# Patient Record
Sex: Female | Born: 1990
Health system: Southern US, Community
[De-identification: ages and names within clinical notes are randomized; demographics above are authoritative.]

## PROBLEM LIST (undated history)

## (undated) DIAGNOSIS — Q250 Patent ductus arteriosus: Secondary | ICD-10-CM

## (undated) DIAGNOSIS — Q971 Female with more than three X chromosomes: Secondary | ICD-10-CM

## (undated) HISTORY — PX: LEG SURGERY: SHX1003

---
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
## 2011-12-25 ENCOUNTER — Emergency Department (HOSPITAL_COMMUNITY): Payer: Medicaid Other

## 2011-12-25 ENCOUNTER — Emergency Department (HOSPITAL_COMMUNITY)
Admission: EM | Admit: 2011-12-25 | Discharge: 2011-12-25 | Disposition: A | Discharge status: Home / Self Care | Payer: Medicaid Other | Attending: Emergency Medicine | Admitting: Emergency Medicine

## 2011-12-25 ENCOUNTER — Encounter (HOSPITAL_COMMUNITY): Payer: Self-pay | Admitting: *Deleted

## 2011-12-25 DIAGNOSIS — R531 Weakness: Secondary | ICD-10-CM

## 2011-12-25 DIAGNOSIS — R51 Headache: Secondary | ICD-10-CM | POA: Insufficient documentation

## 2011-12-25 DIAGNOSIS — Y929 Unspecified place or not applicable: Secondary | ICD-10-CM | POA: Insufficient documentation

## 2011-12-25 DIAGNOSIS — J029 Acute pharyngitis, unspecified: Secondary | ICD-10-CM | POA: Insufficient documentation

## 2011-12-25 DIAGNOSIS — Z87798 Personal history of other (corrected) congenital malformations: Secondary | ICD-10-CM | POA: Insufficient documentation

## 2011-12-25 DIAGNOSIS — J3489 Other specified disorders of nose and nasal sinuses: Secondary | ICD-10-CM | POA: Insufficient documentation

## 2011-12-25 DIAGNOSIS — R5381 Other malaise: Secondary | ICD-10-CM | POA: Insufficient documentation

## 2011-12-25 DIAGNOSIS — R05 Cough: Secondary | ICD-10-CM | POA: Insufficient documentation

## 2011-12-25 DIAGNOSIS — B353 Tinea pedis: Secondary | ICD-10-CM | POA: Insufficient documentation

## 2011-12-25 DIAGNOSIS — R5383 Other fatigue: Secondary | ICD-10-CM | POA: Insufficient documentation

## 2011-12-25 DIAGNOSIS — Z8774 Personal history of (corrected) congenital malformations of heart and circulatory system: Secondary | ICD-10-CM | POA: Insufficient documentation

## 2011-12-25 DIAGNOSIS — R509 Fever, unspecified: Secondary | ICD-10-CM | POA: Insufficient documentation

## 2011-12-25 DIAGNOSIS — Y939 Activity, unspecified: Secondary | ICD-10-CM | POA: Insufficient documentation

## 2011-12-25 DIAGNOSIS — Z79899 Other long term (current) drug therapy: Secondary | ICD-10-CM | POA: Insufficient documentation

## 2011-12-25 DIAGNOSIS — R059 Cough, unspecified: Secondary | ICD-10-CM | POA: Insufficient documentation

## 2011-12-25 HISTORY — DX: Patent ductus arteriosus: Q25.0

## 2011-12-25 HISTORY — DX: Female with more than three x chromosomes: Q97.1

## 2011-12-25 LAB — CBC WITH DIFFERENTIAL/PLATELET
Basophils Absolute: 0 10*3/uL (ref 0.0–0.1)
Basophils Relative: 0 % (ref 0–1)
Eosinophils Absolute: 0.2 10*3/uL (ref 0.0–0.7)
Eosinophils Relative: 3 % (ref 0–5)
HCT: 41.9 % (ref 36.0–46.0)
Hemoglobin: 13.9 g/dL (ref 12.0–15.0)
Lymphocytes Relative: 50 % — ABNORMAL HIGH (ref 12–46)
Lymphs Abs: 2.8 10*3/uL (ref 0.7–4.0)
MCH: 31.2 pg (ref 26.0–34.0)
MCHC: 33.2 g/dL (ref 30.0–36.0)
MCV: 93.9 fL (ref 78.0–100.0)
Monocytes Absolute: 0.4 10*3/uL (ref 0.1–1.0)
Monocytes Relative: 7 % (ref 3–12)
Neutro Abs: 2.2 10*3/uL (ref 1.7–7.7)
Neutrophils Relative %: 40 % — ABNORMAL LOW (ref 43–77)
Platelets: 157 10*3/uL (ref 150–400)
RBC: 4.46 MIL/uL (ref 3.87–5.11)
RDW: 12.8 % (ref 11.5–15.5)
WBC: 5.6 10*3/uL (ref 4.0–10.5)

## 2011-12-25 LAB — BASIC METABOLIC PANEL
BUN: 10 mg/dL (ref 6–23)
CO2: 28 mEq/L (ref 19–32)
Calcium: 10.8 mg/dL — ABNORMAL HIGH (ref 8.4–10.5)
Chloride: 104 mEq/L (ref 96–112)
Creatinine, Ser: 0.6 mg/dL (ref 0.50–1.10)
GFR calc Af Amer: >90 mL/min (ref >90)
GFR calc non Af Amer: >90 mL/min (ref >90)
Glucose, Bld: 95 mg/dL (ref 70–99)
Potassium: 4.1 mEq/L (ref 3.5–5.1)
Sodium: 144 mEq/L (ref 135–145)

## 2011-12-25 NOTE — ED Provider Notes (Signed)
History     CSN: [id number]  Arrival date & time 12/25/11  1724   First MD Initiated Contact with Patient 12/25/11 2028      Chief Complaint  Patient presents with  . Insect Bite    (Consider location/radiation/quality/duration/timing/severity/associated sxs/prior treatment) HPI Cindy Vaughan is a 21 y.o. female who presents with her parents with complaint of a lesion on right foot, intermittent fever, fatigue, choking episodes, and a possible syncopal episode at home. Symptoms began 11 days ago when they noted a lesion to the right foot. Per family, it looked like a spider bite with two fang marks. Pt has been seen several times by her pcp, dr Mayford Knife, and was diagnosed with possible fungal-herpetic lesion. She is currently on bactrim, mycolog cream, and acyclovir for this infection. It is getting better. However, family is concerned that pt is more fatigued, not acting like herself, and had to call 911 two last days for choking episodes where she is saying her throat hurts, and she starts having difficulty breathing, and her face turns red. She was not brought to the hospital both times, EMS thought it could be anxiety.   Past Medical History  Diagnosis Date  . Penta X syndrome   . PDA (patent ductus arteriosus)     Past Surgical History  Procedure Date  . Leg surgery     No family history on file.  History  Substance Use Topics  . Smoking status: Never Smoker   . Smokeless tobacco: Not on file  . Alcohol Use: No    OB History    Grav Para Term Preterm Abortions TAB SAB Ect Mult Living                  Review of Systems  Constitutional: Positive for fever and chills.  HENT: Positive for congestion and sore throat.   Respiratory: Positive for cough and choking.   Skin: Positive for wound.  Neurological: Positive for weakness and headaches.  All other systems reviewed and are negative.    Allergies  Review of patient's allergies indicates no known  allergies.  Home Medications   Current Outpatient Rx  Name  Route  Sig  Dispense  Refill  . ACYCLOVIR 200 MG/5ML PO SUSP   Oral   Take 200 mg by mouth 4 (four) times daily - after meals and at bedtime. 2 tsp         . NYSTATIN-TRIAMCINOLONE 100000-0.1 UNIT/GM-% EX CREA   Topical   Apply 1 application topically 2 (two) times daily. For rash on foot due to possible spider bite         . SULFAMETHOXAZOLE-TRIMETHOPRIM 200-40 MG/5ML PO SUSP   Oral   Take 10 mLs by mouth 2 (two) times daily. For 10 days           BP 94/61  Pulse 61  Temp 97.5 F (36.4 C) (Oral)  Resp 24  SpO2 98%  Physical Exam  Nursing note and vitals reviewed. Constitutional: No distress.       Thin appearing  HENT:  Head: Normocephalic.  Right Ear: External ear normal.  Left Ear: External ear normal.  Nose: Nose normal.  Mouth/Throat: Oropharynx is clear and moist. No oropharyngeal exudate.       TMs normal bilaterally  Eyes: Conjunctivae normal are normal. Pupils are equal, round, and reactive to light.  Neck: Normal range of motion. Neck supple.  Cardiovascular: Normal rate, regular rhythm and normal heart sounds.   Pulmonary/Chest: Effort  normal and breath sounds normal. No respiratory distress. She has no wheezes. She has no rales.  Abdominal: Soft. Bowel sounds are normal. She exhibits no distension. There is no tenderness. There is no rebound.  Musculoskeletal: Normal range of motion. She exhibits no edema.       See skin exam  Neurological: She is alert.  Skin: Skin is warm and dry.       There is a 3x4cm erythematous, scaly lesion with surrounding vesicular satellite lesions to the right dorsal foot, just proximal to the 1st and 2nd toes. Non tender. No drainage.   Psychiatric: She has a normal mood and affect. Her behavior is normal.    ED Course  Procedures (including critical care time)  Family here with pt, given her MR and chromosomal disease, they are her care takers. Concerned  about foot skin lesion and choking episodes. Will get cxr, labs pending.  Results for orders placed during the hospital encounter of 12/25/11  CBC WITH DIFFERENTIAL      Component Value Range   WBC 5.6  4.0 - 10.5 K/uL   RBC 4.46  3.87 - 5.11 MIL/uL   Hemoglobin 13.9  12.0 - 15.0 g/dL   HCT 19.1  47.8 - 29.5 %   MCV 93.9  78.0 - 100.0 fL   MCH 31.2  26.0 - 34.0 pg   MCHC 33.2  30.0 - 36.0 g/dL   RDW 62.1  30.8 - 65.7 %   Platelets 157  150 - 400 K/uL   Neutrophils Relative 40 (*) 43 - 77 %   Lymphocytes Relative 50 (*) 12 - 46 %   Monocytes Relative 7  3 - 12 %   Eosinophils Relative 3  0 - 5 %   Basophils Relative 0  0 - 1 %   Neutro Abs 2.2  1.7 - 7.7 K/uL   Lymphs Abs 2.8  0.7 - 4.0 K/uL   Monocytes Absolute 0.4  0.1 - 1.0 K/uL   Eosinophils Absolute 0.2  0.0 - 0.7 K/uL   Basophils Absolute 0.0  0.0 - 0.1 K/uL   WBC Morphology FEW ATYPICAL LYMPHS NOTED    BASIC METABOLIC PANEL      Component Value Range   Sodium 144  135 - 145 mEq/L   Potassium 4.1  3.5 - 5.1 mEq/L   Chloride 104  96 - 112 mEq/L   CO2 28  19 - 32 mEq/L   Glucose, Bld 95  70 - 99 mg/dL   BUN 10  6 - 23 mg/dL   Creatinine, Ser 8.46  0.50 - 1.10 mg/dL   Calcium 96.2 (*) 8.4 - 10.5 mg/dL   GFR calc non Af Amer >90  >90 mL/min   GFR calc Af Amer >90  >90 mL/min   Dg Chest 2 View  12/25/2011  *RADIOLOGY REPORT*  Clinical Data: Insect bite.Chromosomal anomaly.  CHEST - 2 VIEW  Comparison: 10/06/2006.  Findings: Dysmorphic appearance of the chest with an abnormal configuration of the ribs.  Narrowed transverse diameter of the chest.  No airspace disease.  No effusion.  Cardiopericardial silhouette appears within normal limits.  Mild levoconvex lumbar scoliosis. Left sided aortic arch.  Based on liver and splenic shadow, situs solitus appears present.  IMPRESSION: No acute cardiopulmonary disease.  Stable appearance of the chest.   Original Report Authenticated By: Andreas Newport, M.D.       1. Fungal infection  of foot   2. Weakness  MDM  Pt's foot shows fungal vs herpetic lesion, doubt infectious, no surrounding cellulitis, non tender. Pt non toxic appearing, comfortable, afebrile, normal VS. She is already being treated with acyclovir, mycolog cream, bactrim. Her labs and cxr negative. Discussed with Dr. Juleen China. Pt will need close recheck on her foot lesion with pcp, for now on correct medications. Possible onset of URI vs acid reflux could explain her throat pain and chocking episodes. Her CXR is clear, no exam does not show any abnormalities. Will refer back to PCP for recheck.           BETZABETH DERRINGER, [place] 12/25/11 2232

## 2011-12-25 NOTE — ED Notes (Signed)
Symptoms since noticing bite on Saturday November 30th on top of right foot.  Symptoms of headache, throat sore, sob, nausea, sweating, joint pain, rash to foot, fever, and lethargic.  Pt reports right elbow joint has been hurting her.  Pt is alert and the area is spreading on right foot.

## 2011-12-26 NOTE — ED Provider Notes (Signed)
Medical screening examination/treatment/procedure(s) were performed by non-physician practitioner and as supervising physician I was immediately available for consultation/collaboration.  Sinjin Amero, MD 12/26/11 0137 

## 2013-05-05 IMAGING — CR DG CHEST 2V
2 series · 2 of 2 positions shown · non-contrast
Comparison: 10/06/2006.

CLINICAL DATA: Insect bite.Chromosomal anomaly.

CHEST - 2 VIEW

[w chest pa]
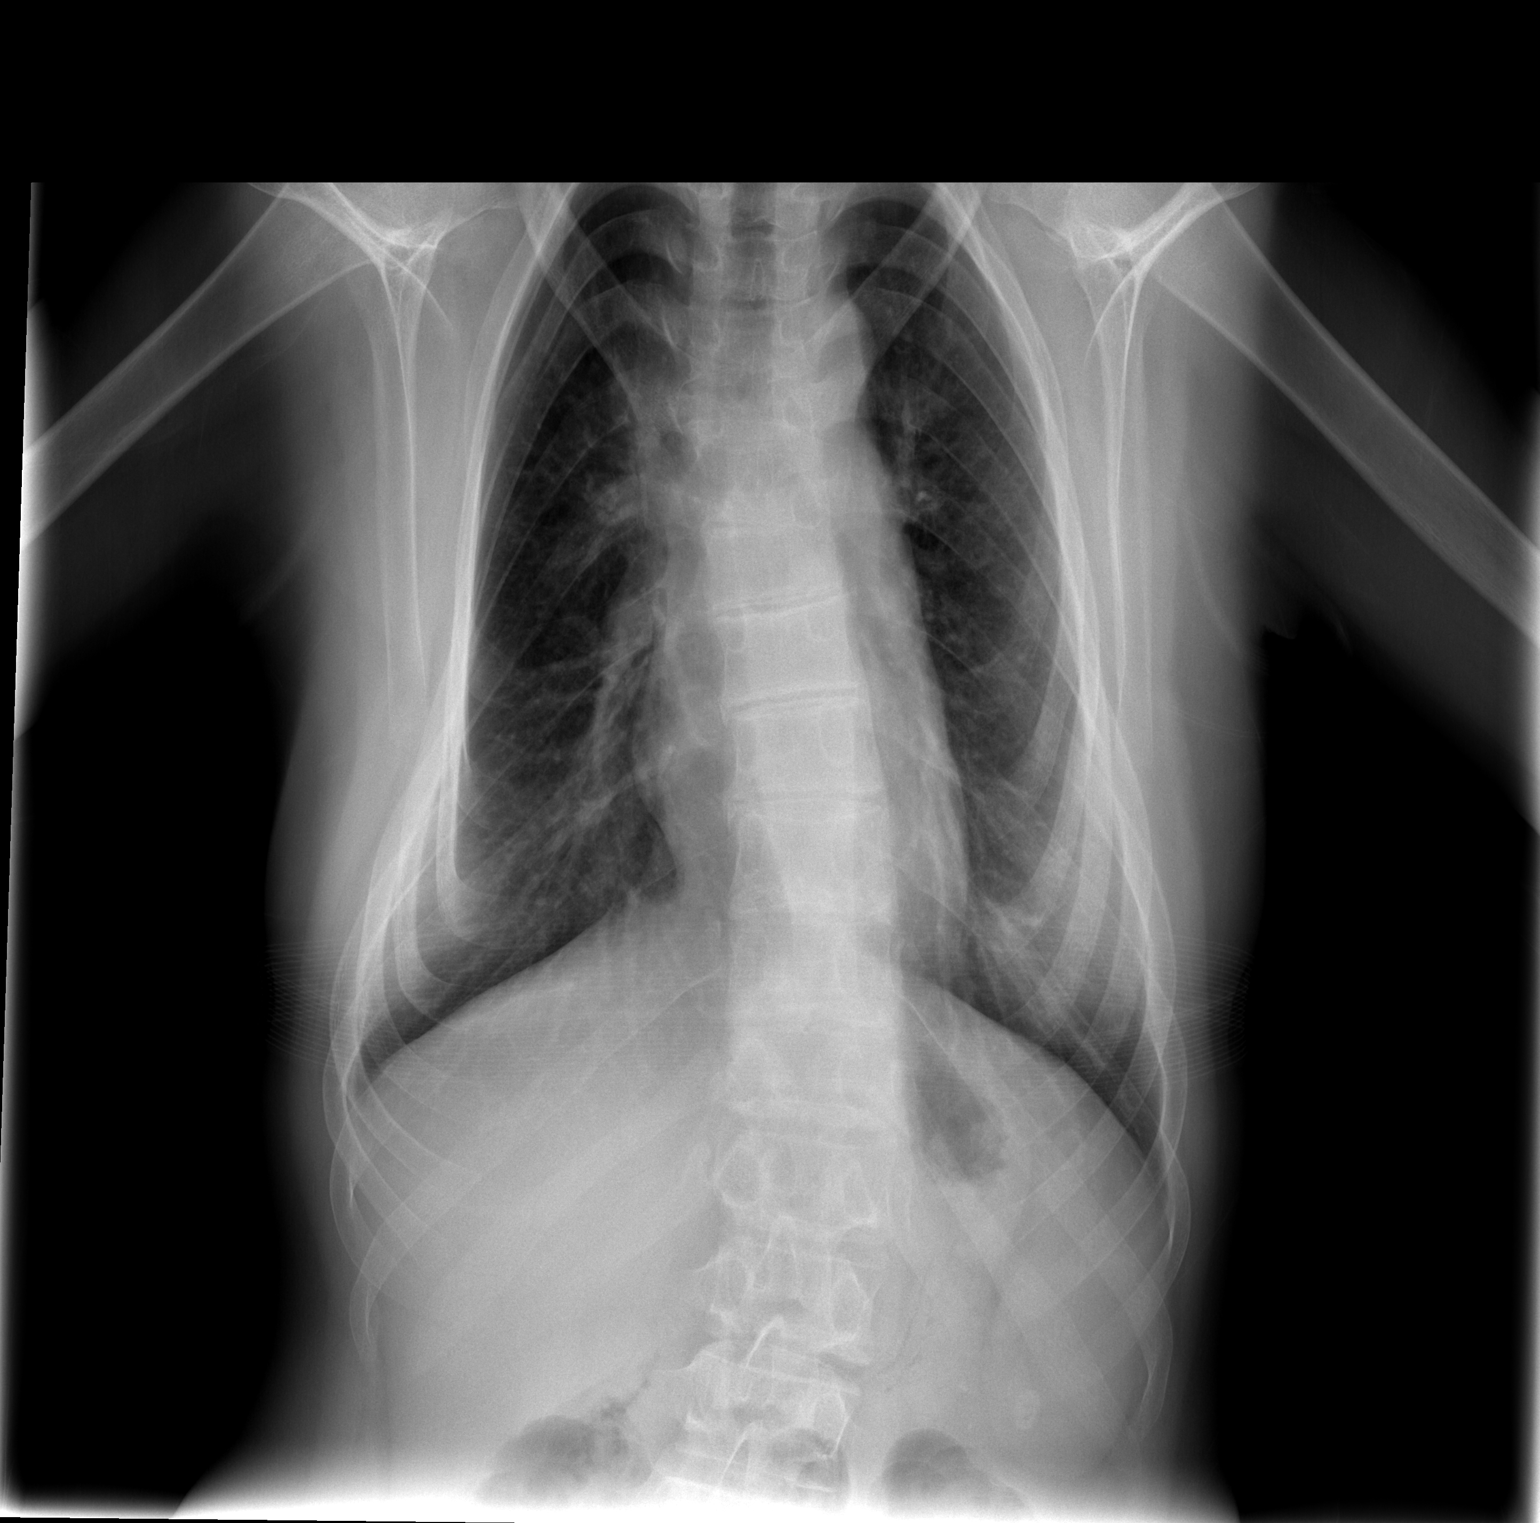

[w chest lat]
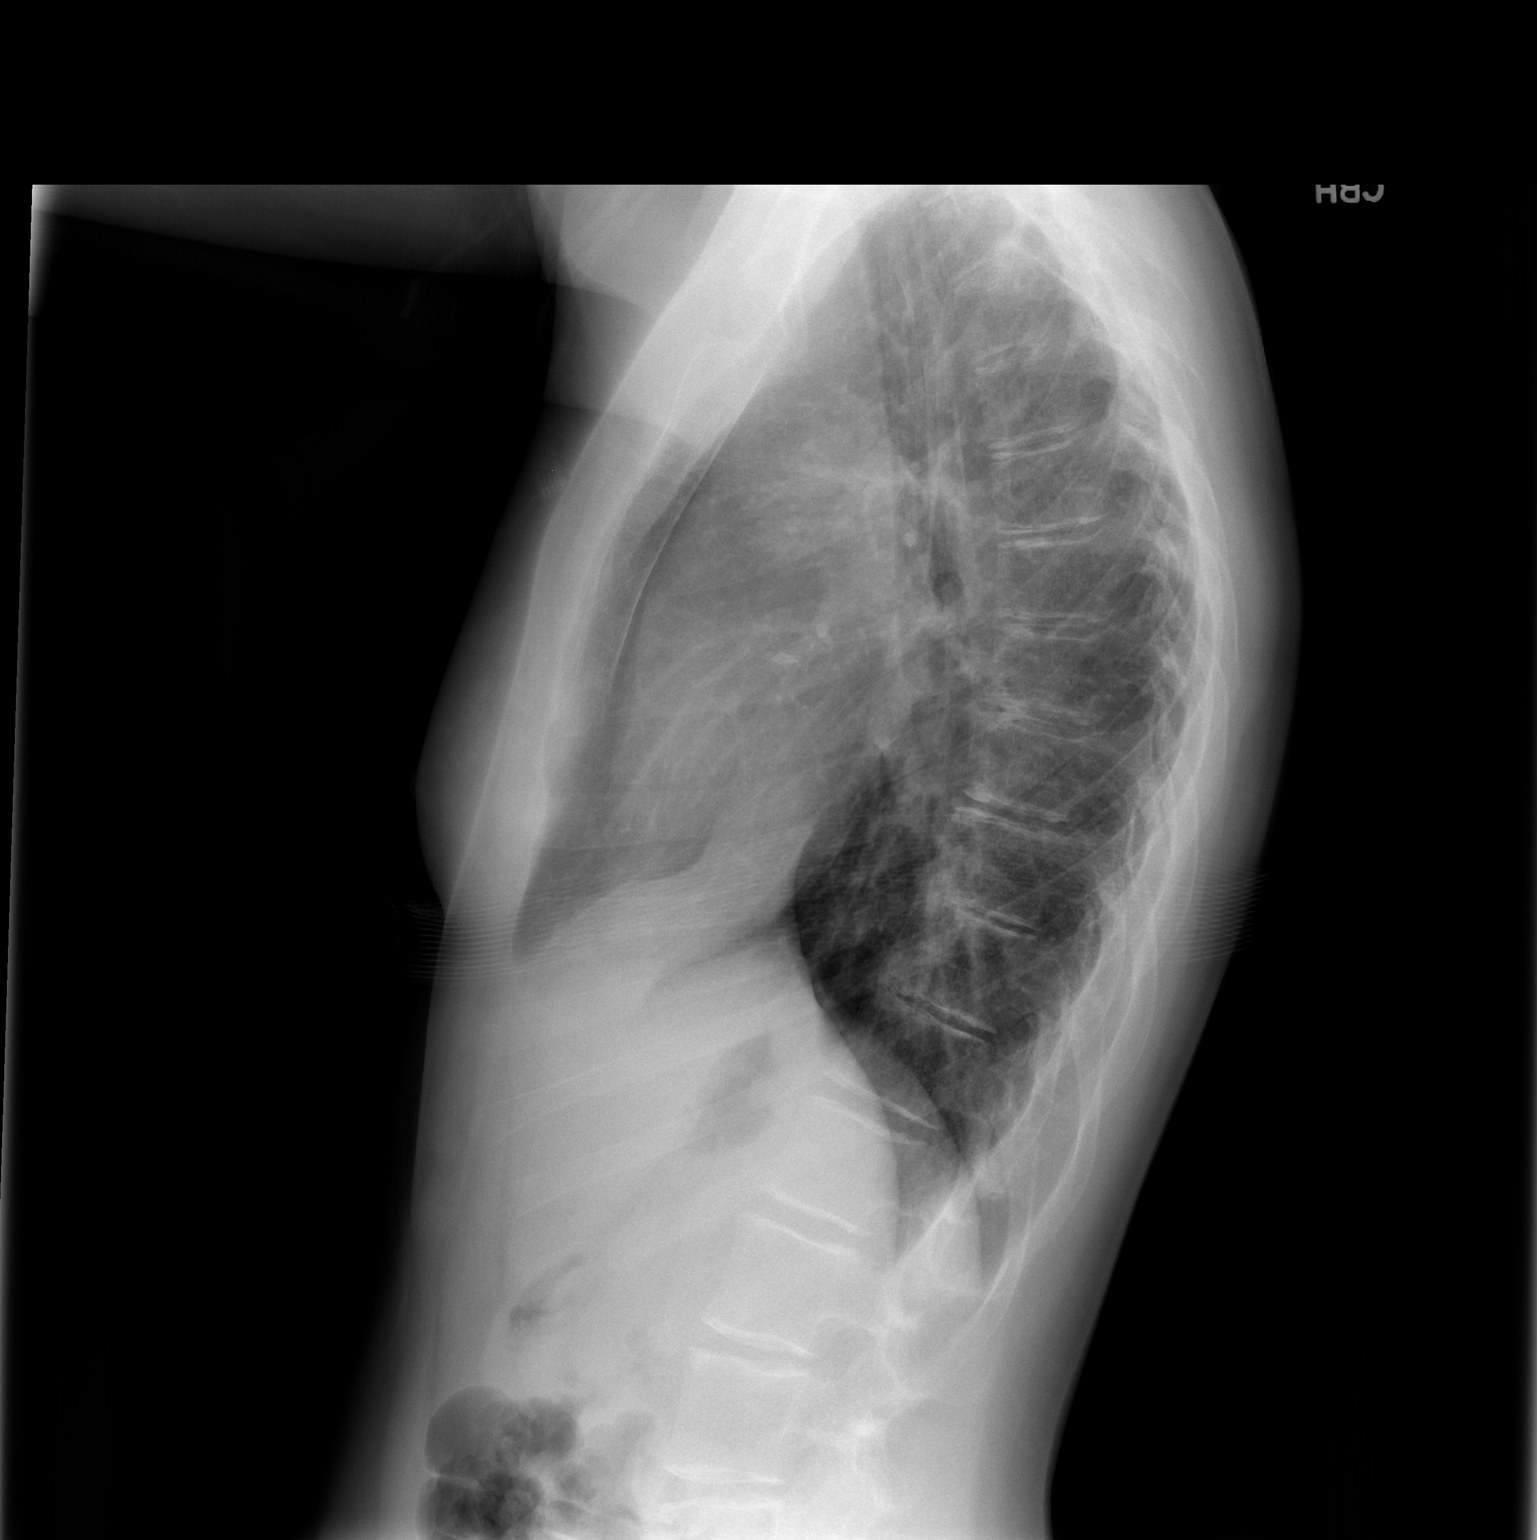

[2 of 2 positions shown; findings below may reference images not displayed]

FINDINGS: Dysmorphic appearance of the chest with an abnormal
configuration of the ribs.  Narrowed transverse diameter of the
chest.  No airspace disease.  No effusion.  Cardiopericardial
silhouette appears within normal limits.  Mild levoconvex lumbar
scoliosis. Left sided aortic arch.  Based on liver and splenic
shadow, situs solitus appears present.
IMPRESSION: No acute cardiopulmonary disease.  Stable appearance of the chest.

## 2013-06-22 DIAGNOSIS — H60399 Other infective otitis externa, unspecified ear: Secondary | ICD-10-CM | POA: Diagnosis not present

## 2013-06-22 DIAGNOSIS — H6120 Impacted cerumen, unspecified ear: Secondary | ICD-10-CM | POA: Diagnosis not present

## 2013-06-22 DIAGNOSIS — J019 Acute sinusitis, unspecified: Secondary | ICD-10-CM | POA: Diagnosis not present

## 2013-07-02 ENCOUNTER — Encounter (HOSPITAL_COMMUNITY): Payer: Self-pay | Admitting: Emergency Medicine

## 2013-07-02 ENCOUNTER — Emergency Department (HOSPITAL_COMMUNITY)
Admission: EM | Admit: 2013-07-02 | Discharge: 2013-07-02 | Disposition: A | Discharge status: Home / Self Care | Payer: Medicare Other | Source: Home / Self Care | Attending: Family Medicine | Admitting: Family Medicine

## 2013-07-02 DIAGNOSIS — Z23 Encounter for immunization: Secondary | ICD-10-CM

## 2013-07-02 DIAGNOSIS — S91309A Unspecified open wound, unspecified foot, initial encounter: Secondary | ICD-10-CM | POA: Diagnosis not present

## 2013-07-02 MED ORDER — TETANUS-DIPHTH-ACELL PERTUSSIS 5-2.5-18.5 LF-MCG/0.5 IM SUSP
INTRAMUSCULAR | Status: AC
  Filled 2013-07-02: qty 0.5

## 2013-07-02 MED ORDER — TETANUS-DIPHTH-ACELL PERTUSSIS 5-2.5-18.5 LF-MCG/0.5 IM SUSP
0.5000 mL | Freq: Once | INTRAMUSCULAR | Status: AC
  Administered 2013-07-02: 0.5 mL via INTRAMUSCULAR

## 2013-07-02 NOTE — Discharge Instructions (Signed)
Return or see your doctor if further problems.

## 2013-07-02 NOTE — ED Notes (Signed)
Stepped on a rusty metal piece two days ago.  Mom concerned for tetanus status

## 2013-07-02 NOTE — ED Provider Notes (Signed)
CSN: [id number]     Arrival date & time 07/02/13  1324 History   First MD Initiated Contact with Patient 07/02/13 1615     Chief Complaint  Patient presents with  . Immunizations   (Consider location/radiation/quality/duration/timing/severity/associated sxs/prior Treatment) Patient is a 23 y.o. female presenting with foot injury. The history is provided by a parent.  Foot Injury Location:  Foot Time since incident:  2 days Injury: yes   Mechanism of injury comment:  Stepped on rusty metal getting out of pool yest, mother removed and it bled for approx 10 min., has been appying antibiotic med since. Foot location:  Sole of L foot Pain details:    Radiates to:  Does not radiate Chronicity:  New Dislocation: no   Tetanus status:  Out of date Prior injury to area:  No   Past Medical History  Diagnosis Date  . Penta X syndrome   . PDA (patent ductus arteriosus)    Past Surgical History  Procedure Laterality Date  . Leg surgery     No family history on file. History  Substance Use Topics  . Smoking status: Never Smoker   . Smokeless tobacco: Not on file  . Alcohol Use: No   OB History   Grav Para Term Preterm Abortions TAB SAB Ect Mult Living                 Review of Systems  Constitutional: Negative.   Skin: Positive for wound.    Allergies  Review of patient's allergies indicates no known allergies.  Home Medications   Prior to Admission medications   Medication Sig Start Date End Date Taking? Authorizing Provider  acyclovir (ZOVIRAX) 200 MG/5ML suspension Take 200 mg by mouth 4 (four) times daily - after meals and at bedtime. 2 tsp    Historical Provider, MD  nystatin-triamcinolone (MYCOLOG II) cream Apply 1 application topically 2 (two) times daily. For rash on foot due to possible spider bite    Historical Provider, MD  sulfamethoxazole-trimethoprim (BACTRIM,SEPTRA) 200-40 MG/5ML suspension Take 10 mLs by mouth 2 (two) times daily. For 10 days    Historical  Provider, MD   BP 98/66  Pulse 75  Temp(Src) 98.5 F (36.9 C) (Oral)  Resp 15  SpO2 98% Physical Exam  Nursing note and vitals reviewed. Constitutional: She is oriented to person, place, and time. She appears well-developed and well-nourished.  Neurological: She is alert and oriented to person, place, and time.  Skin: Skin is warm and dry. No erythema.  Benign puncture site to 5th mt area of plantar aspect of left foot.    ED Course  Procedures (including critical care time) Labs Review Labs Reviewed - No data to display  Imaging Review No results found.   MDM   1. Immunization due        Linna HoffJames D Kindl, MD 07/02/13 [phone]

## 2013-08-02 DIAGNOSIS — N390 Urinary tract infection, site not specified: Secondary | ICD-10-CM | POA: Diagnosis not present

## 2013-08-02 DIAGNOSIS — R319 Hematuria, unspecified: Secondary | ICD-10-CM | POA: Diagnosis not present

## 2013-08-02 DIAGNOSIS — G4700 Insomnia, unspecified: Secondary | ICD-10-CM | POA: Diagnosis not present

## 2013-08-23 DIAGNOSIS — N390 Urinary tract infection, site not specified: Secondary | ICD-10-CM | POA: Diagnosis not present

## 2013-08-23 DIAGNOSIS — R319 Hematuria, unspecified: Secondary | ICD-10-CM | POA: Diagnosis not present

## 2014-07-27 DIAGNOSIS — S169XXS Unspecified injury of muscle, fascia and tendon at neck level, sequela: Secondary | ICD-10-CM | POA: Diagnosis not present

## 2014-07-27 DIAGNOSIS — K088 Other specified disorders of teeth and supporting structures: Secondary | ICD-10-CM | POA: Diagnosis not present

## 2015-08-09 DIAGNOSIS — Z3480 Encounter for supervision of other normal pregnancy, unspecified trimester: Secondary | ICD-10-CM | POA: Diagnosis not present

## 2015-08-09 DIAGNOSIS — E509 Vitamin A deficiency, unspecified: Secondary | ICD-10-CM | POA: Diagnosis not present

## 2015-08-09 DIAGNOSIS — R791 Abnormal coagulation profile: Secondary | ICD-10-CM | POA: Diagnosis not present

## 2015-08-09 DIAGNOSIS — E7801 Familial hypercholesterolemia: Secondary | ICD-10-CM | POA: Diagnosis not present

## 2015-10-30 DIAGNOSIS — Q188 Other specified congenital malformations of face and neck: Secondary | ICD-10-CM | POA: Diagnosis not present

## 2015-10-30 DIAGNOSIS — F71 Moderate intellectual disabilities: Secondary | ICD-10-CM | POA: Diagnosis not present

## 2015-11-15 ENCOUNTER — Ambulatory Visit: Payer: Medicare Other | Admitting: Podiatry

## 2015-11-21 ENCOUNTER — Ambulatory Visit (INDEPENDENT_AMBULATORY_CARE_PROVIDER_SITE_OTHER): Payer: Medicare Other

## 2015-11-21 ENCOUNTER — Ambulatory Visit (INDEPENDENT_AMBULATORY_CARE_PROVIDER_SITE_OTHER): Payer: Medicare Other | Admitting: Podiatry

## 2015-11-21 ENCOUNTER — Encounter: Payer: Self-pay | Admitting: Podiatry

## 2015-11-21 ENCOUNTER — Other Ambulatory Visit: Payer: Self-pay | Admitting: *Deleted

## 2015-11-21 VITALS — BP 96/67 | HR 59 | Resp 16

## 2015-11-21 DIAGNOSIS — M2042 Other hammer toe(s) (acquired), left foot: Secondary | ICD-10-CM

## 2015-11-21 DIAGNOSIS — M79671 Pain in right foot: Secondary | ICD-10-CM

## 2015-11-21 DIAGNOSIS — M79672 Pain in left foot: Secondary | ICD-10-CM | POA: Diagnosis not present

## 2015-11-21 DIAGNOSIS — M216X9 Other acquired deformities of unspecified foot: Secondary | ICD-10-CM | POA: Diagnosis not present

## 2015-11-21 DIAGNOSIS — M21869 Other specified acquired deformities of unspecified lower leg: Secondary | ICD-10-CM

## 2015-11-21 DIAGNOSIS — M2010 Hallux valgus (acquired), unspecified foot: Secondary | ICD-10-CM

## 2015-11-21 DIAGNOSIS — Q6650 Congenital pes planus, unspecified foot: Secondary | ICD-10-CM

## 2015-11-21 DIAGNOSIS — M2041 Other hammer toe(s) (acquired), right foot: Secondary | ICD-10-CM

## 2015-11-21 NOTE — Progress Notes (Signed)
   Subjective:    Patient ID: Cindy Vaughan, female    DOB: [birth year], 25 y.o.   MRN: [id number]  HPI: She presents today with her mother and she is complaining of painful posterior legs and heels bilaterally. She states that they tingle and they hurt. She states this is been going on for many years and has been seen by an orthopedic specialist in the past for tibial valgum which was successfully reduced.  Cindy Vaughan has Pent "X" syndrome which is 5X chromosomes. This has resulted in the late development and abnormal osseous structures.  Her mother is concerned about her tight Achilles and chronic pain as well as her the way her daughter walks particularly with flatfeet and abduction of the forefoot and hindfoot. She is also concerned about the severe angular deformities of her toes bilaterally.    Review of Systems  All other systems reviewed and are negative.      Objective:   Physical Exam: Vital signs are stable she is alert and oriented she is not very focal but does seem to understand conversation. Pulses are strongly palpable bilateral. Neurologic sensorium is intact deep tendon reflexes are not elicitable and muscle strength is bearable with dorsiflexion plantarflexion inversion and eversion secondary to not being able to comprehend the demands. She does have a little clonic tonic type muscle tone. She has moderate to severe gastroc equinus with tenderness on palpation of the sural nerve distribution and the Achilles. She has severe pes planus flexible in nature without coalition based on physical exam and radiographic evaluation. She does have severe hallux valgus deformity as well as hammertoe deformities. Radiographs taken today do demonstrate osseously mature individual with decent bone stock. No spurring but hammertoe deformities and increase in forefoot deformity is moderate to severe. Cutaneous evaluation does not demonstrate any osseous abnormalities.    Assessment & Plan:    Assessment: Young lady with a developmental disorder. Gastroc equinus and tight Achilles tendon resulting in flatfoot deformity and severe hammertoe deformities and bunion deformities. Sural nerve neuritis  Plan: Discussed etiology pathology conservative versus surgical therapies. At this point I recommended that she follow-up with Dr. Loreta AveWagner to consider gastroc recession Achilles tendon lengthening flatfoot repair and a forefoot reconstruction.  I expressed to the patient and her mother that this is a lot of surgery and he may have to be staged. Her mother understands this and is amenable to it like to have her daughter out of pain. I highly recommend medical clearance as well as a lot of work to make sure there are no other healing abnormalities. Her mother did state that she healed adequately and quickly with her tibial osteotomy. I will be happy to assist in any surgery performed.

## 2015-11-24 DIAGNOSIS — Q188 Other specified congenital malformations of face and neck: Secondary | ICD-10-CM | POA: Diagnosis not present

## 2015-11-24 DIAGNOSIS — F71 Moderate intellectual disabilities: Secondary | ICD-10-CM | POA: Diagnosis not present

## 2015-12-29 ENCOUNTER — Encounter: Payer: Self-pay | Admitting: Podiatry

## 2015-12-29 ENCOUNTER — Ambulatory Visit (INDEPENDENT_AMBULATORY_CARE_PROVIDER_SITE_OTHER): Payer: Medicare Other | Admitting: Podiatry

## 2015-12-29 DIAGNOSIS — M216X9 Other acquired deformities of unspecified foot: Secondary | ICD-10-CM

## 2015-12-29 DIAGNOSIS — M2042 Other hammer toe(s) (acquired), left foot: Secondary | ICD-10-CM

## 2015-12-29 DIAGNOSIS — Q6650 Congenital pes planus, unspecified foot: Secondary | ICD-10-CM | POA: Diagnosis not present

## 2015-12-29 DIAGNOSIS — M2041 Other hammer toe(s) (acquired), right foot: Secondary | ICD-10-CM | POA: Diagnosis not present

## 2015-12-29 DIAGNOSIS — M2010 Hallux valgus (acquired), unspecified foot: Secondary | ICD-10-CM

## 2015-12-29 DIAGNOSIS — M21869 Other specified acquired deformities of unspecified lower leg: Secondary | ICD-10-CM

## 2016-01-01 DIAGNOSIS — M2042 Other hammer toe(s) (acquired), left foot: Secondary | ICD-10-CM

## 2016-01-01 DIAGNOSIS — M2041 Other hammer toe(s) (acquired), right foot: Secondary | ICD-10-CM | POA: Insufficient documentation

## 2016-01-01 DIAGNOSIS — M2010 Hallux valgus (acquired), unspecified foot: Secondary | ICD-10-CM | POA: Insufficient documentation

## 2016-01-01 DIAGNOSIS — Q6650 Congenital pes planus, unspecified foot: Secondary | ICD-10-CM | POA: Insufficient documentation

## 2016-01-01 NOTE — Progress Notes (Signed)
Subjective: 25 year old female presents the office today with her parents or concerns of bilateral flatfeet, bunions. She presents today for surgical consultation at the request of Dr. Al CorpusHyatt. Before starting the limited the patient's mom states they do not want to proceed with surgery. The patient is able to do daily activities for any pain. He went to the parade the other weekend and she is able to walk around and not have any pain. Then using a topical cream to her feet which does help when she does get discomfort. Other than the cream she's had no other significant treatment. Denies any systemic complaints such as fevers, chills, nausea, vomiting. No acute changes since last appointment, and no other complaints at this time.   Objective: NAD DP/PT pulses palpable bilaterally, CRT less than 3 seconds There is a decrease in medial arch upon weightbearing. Moderate HAV is present. Hammertoes are present as well. Ankle, subtalar joint range of motion intact. Equinus is present. There is no specific area pinpoint bony tenderness there is no pain vibratory sensation bilaterally. No edema, erythema, increase in warmth to bilateral lower extremities.  No open lesions or pre-ulcerative lesions.  No pain with calf compression, swelling, warmth, erythema  Assessment: 25 year old female flatfoot, equinus, HAV with hammertoes.  Plan: -All treatment options discussed with the patient including all alternatives, risks, complications.  -Previous x-rays as well as the chart was reviewed. -I discussed both conservative and surgical treatment options. This time she has not had significant conservative treatment and we will start with this before proceeding with any surgical intervention. I recommend shoe gear changes and orthotics. They're instrument over-the-counter insert is seen how she does. Also I recommended physical therapy to help with the equinus. A prescription for bench marked physical therapy was  provided today. -Follow-up to physical therapy or sooner if any issues are to arise. -Patient encouraged to call the office with any questions, concerns, change in symptoms.   Cindy CurdMatthew Rosalea Vaughan, DPM

## 2016-02-16 DIAGNOSIS — H6123 Impacted cerumen, bilateral: Secondary | ICD-10-CM | POA: Diagnosis not present

## 2016-03-06 DIAGNOSIS — H9203 Otalgia, bilateral: Secondary | ICD-10-CM | POA: Diagnosis not present

## 2016-03-06 DIAGNOSIS — H93293 Other abnormal auditory perceptions, bilateral: Secondary | ICD-10-CM | POA: Diagnosis not present

## 2016-03-06 DIAGNOSIS — Q978 Other specified sex chromosome abnormalities, female phenotype: Secondary | ICD-10-CM | POA: Diagnosis not present

## 2016-03-06 DIAGNOSIS — H6123 Impacted cerumen, bilateral: Secondary | ICD-10-CM | POA: Diagnosis not present

## 2016-05-09 DIAGNOSIS — Z131 Encounter for screening for diabetes mellitus: Secondary | ICD-10-CM | POA: Diagnosis not present

## 2016-05-09 DIAGNOSIS — R69 Illness, unspecified: Secondary | ICD-10-CM | POA: Diagnosis not present

## 2016-05-09 DIAGNOSIS — M542 Cervicalgia: Secondary | ICD-10-CM | POA: Diagnosis not present

## 2016-05-17 DIAGNOSIS — M542 Cervicalgia: Secondary | ICD-10-CM | POA: Diagnosis not present

## 2016-05-17 DIAGNOSIS — R69 Illness, unspecified: Secondary | ICD-10-CM | POA: Diagnosis not present

## 2016-07-31 DIAGNOSIS — J302 Other seasonal allergic rhinitis: Secondary | ICD-10-CM | POA: Diagnosis not present

## 2016-07-31 DIAGNOSIS — R69 Illness, unspecified: Secondary | ICD-10-CM | POA: Diagnosis not present

## 2016-07-31 DIAGNOSIS — Z131 Encounter for screening for diabetes mellitus: Secondary | ICD-10-CM | POA: Diagnosis not present

## 2016-07-31 DIAGNOSIS — R1313 Dysphagia, pharyngeal phase: Secondary | ICD-10-CM | POA: Diagnosis not present

## 2016-07-31 DIAGNOSIS — M542 Cervicalgia: Secondary | ICD-10-CM | POA: Diagnosis not present

## 2016-07-31 DIAGNOSIS — M4120 Other idiopathic scoliosis, site unspecified: Secondary | ICD-10-CM | POA: Diagnosis not present

## 2016-08-21 DIAGNOSIS — R1312 Dysphagia, oropharyngeal phase: Secondary | ICD-10-CM | POA: Diagnosis not present

## 2016-08-21 DIAGNOSIS — K5900 Constipation, unspecified: Secondary | ICD-10-CM | POA: Diagnosis not present

## 2016-08-27 DIAGNOSIS — M40209 Unspecified kyphosis, site unspecified: Secondary | ICD-10-CM | POA: Diagnosis not present

## 2016-08-27 DIAGNOSIS — Z681 Body mass index (BMI) 19 or less, adult: Secondary | ICD-10-CM | POA: Diagnosis not present

## 2016-10-14 DIAGNOSIS — Q971 Female with more than three X chromosomes: Secondary | ICD-10-CM | POA: Diagnosis not present

## 2016-10-14 DIAGNOSIS — M40209 Unspecified kyphosis, site unspecified: Secondary | ICD-10-CM | POA: Diagnosis not present

## 2016-10-29 DIAGNOSIS — Z681 Body mass index (BMI) 19 or less, adult: Secondary | ICD-10-CM | POA: Diagnosis not present

## 2016-10-29 DIAGNOSIS — M40209 Unspecified kyphosis, site unspecified: Secondary | ICD-10-CM | POA: Diagnosis not present

## 2016-11-01 DIAGNOSIS — R1313 Dysphagia, pharyngeal phase: Secondary | ICD-10-CM | POA: Diagnosis not present

## 2016-11-01 DIAGNOSIS — M4120 Other idiopathic scoliosis, site unspecified: Secondary | ICD-10-CM | POA: Diagnosis not present

## 2016-11-01 DIAGNOSIS — M542 Cervicalgia: Secondary | ICD-10-CM | POA: Diagnosis not present

## 2016-11-01 DIAGNOSIS — B80 Enterobiasis: Secondary | ICD-10-CM | POA: Diagnosis not present

## 2016-11-01 DIAGNOSIS — R69 Illness, unspecified: Secondary | ICD-10-CM | POA: Diagnosis not present

## 2016-12-10 DIAGNOSIS — Z681 Body mass index (BMI) 19 or less, adult: Secondary | ICD-10-CM | POA: Diagnosis not present

## 2016-12-10 DIAGNOSIS — M40209 Unspecified kyphosis, site unspecified: Secondary | ICD-10-CM | POA: Diagnosis not present

## 2017-01-31 DIAGNOSIS — R1313 Dysphagia, pharyngeal phase: Secondary | ICD-10-CM | POA: Diagnosis not present

## 2017-01-31 DIAGNOSIS — R69 Illness, unspecified: Secondary | ICD-10-CM | POA: Diagnosis not present

## 2017-01-31 DIAGNOSIS — M542 Cervicalgia: Secondary | ICD-10-CM | POA: Diagnosis not present

## 2017-01-31 DIAGNOSIS — M4120 Other idiopathic scoliosis, site unspecified: Secondary | ICD-10-CM | POA: Diagnosis not present

## 2017-02-18 DIAGNOSIS — H6123 Impacted cerumen, bilateral: Secondary | ICD-10-CM | POA: Diagnosis not present

## 2017-02-21 DIAGNOSIS — R1313 Dysphagia, pharyngeal phase: Secondary | ICD-10-CM | POA: Diagnosis not present

## 2017-02-21 DIAGNOSIS — M542 Cervicalgia: Secondary | ICD-10-CM | POA: Diagnosis not present

## 2017-02-21 DIAGNOSIS — M4120 Other idiopathic scoliosis, site unspecified: Secondary | ICD-10-CM | POA: Diagnosis not present

## 2017-02-21 DIAGNOSIS — K047 Periapical abscess without sinus: Secondary | ICD-10-CM | POA: Diagnosis not present

## 2017-02-21 DIAGNOSIS — R69 Illness, unspecified: Secondary | ICD-10-CM | POA: Diagnosis not present

## 2017-05-05 DIAGNOSIS — K5900 Constipation, unspecified: Secondary | ICD-10-CM | POA: Diagnosis not present

## 2017-05-05 DIAGNOSIS — K047 Periapical abscess without sinus: Secondary | ICD-10-CM | POA: Diagnosis not present

## 2017-05-05 DIAGNOSIS — Z833 Family history of diabetes mellitus: Secondary | ICD-10-CM | POA: Diagnosis not present

## 2017-05-05 DIAGNOSIS — R69 Illness, unspecified: Secondary | ICD-10-CM | POA: Diagnosis not present

## 2017-05-05 DIAGNOSIS — Z823 Family history of stroke: Secondary | ICD-10-CM | POA: Diagnosis not present

## 2017-05-05 DIAGNOSIS — K219 Gastro-esophageal reflux disease without esophagitis: Secondary | ICD-10-CM | POA: Diagnosis not present

## 2017-05-09 DIAGNOSIS — M542 Cervicalgia: Secondary | ICD-10-CM | POA: Diagnosis not present

## 2017-05-09 DIAGNOSIS — K120 Recurrent oral aphthae: Secondary | ICD-10-CM | POA: Diagnosis not present

## 2017-05-09 DIAGNOSIS — M4120 Other idiopathic scoliosis, site unspecified: Secondary | ICD-10-CM | POA: Diagnosis not present

## 2017-05-09 DIAGNOSIS — L0100 Impetigo, unspecified: Secondary | ICD-10-CM | POA: Diagnosis not present

## 2017-05-09 DIAGNOSIS — R69 Illness, unspecified: Secondary | ICD-10-CM | POA: Diagnosis not present

## 2017-05-09 DIAGNOSIS — K121 Other forms of stomatitis: Secondary | ICD-10-CM | POA: Diagnosis not present

## 2017-05-09 DIAGNOSIS — R1313 Dysphagia, pharyngeal phase: Secondary | ICD-10-CM | POA: Diagnosis not present

## 2017-05-20 DIAGNOSIS — W57XXXS Bitten or stung by nonvenomous insect and other nonvenomous arthropods, sequela: Secondary | ICD-10-CM | POA: Diagnosis not present

## 2017-05-20 DIAGNOSIS — M4120 Other idiopathic scoliosis, site unspecified: Secondary | ICD-10-CM | POA: Diagnosis not present

## 2017-05-20 DIAGNOSIS — R69 Illness, unspecified: Secondary | ICD-10-CM | POA: Diagnosis not present

## 2017-05-20 DIAGNOSIS — M542 Cervicalgia: Secondary | ICD-10-CM | POA: Diagnosis not present

## 2017-05-20 DIAGNOSIS — R1313 Dysphagia, pharyngeal phase: Secondary | ICD-10-CM | POA: Diagnosis not present

## 2017-05-20 DIAGNOSIS — S50862S Insect bite (nonvenomous) of left forearm, sequela: Secondary | ICD-10-CM | POA: Diagnosis not present

## 2017-06-27 DIAGNOSIS — R69 Illness, unspecified: Secondary | ICD-10-CM | POA: Diagnosis not present

## 2017-06-27 DIAGNOSIS — R1313 Dysphagia, pharyngeal phase: Secondary | ICD-10-CM | POA: Diagnosis not present

## 2017-06-27 DIAGNOSIS — M542 Cervicalgia: Secondary | ICD-10-CM | POA: Diagnosis not present

## 2017-06-27 DIAGNOSIS — M4120 Other idiopathic scoliosis, site unspecified: Secondary | ICD-10-CM | POA: Diagnosis not present

## 2017-06-27 DIAGNOSIS — K047 Periapical abscess without sinus: Secondary | ICD-10-CM | POA: Diagnosis not present

## 2017-07-11 DIAGNOSIS — M4120 Other idiopathic scoliosis, site unspecified: Secondary | ICD-10-CM | POA: Diagnosis not present

## 2017-07-11 DIAGNOSIS — R69 Illness, unspecified: Secondary | ICD-10-CM | POA: Diagnosis not present

## 2017-07-11 DIAGNOSIS — R1313 Dysphagia, pharyngeal phase: Secondary | ICD-10-CM | POA: Diagnosis not present

## 2017-07-11 DIAGNOSIS — M542 Cervicalgia: Secondary | ICD-10-CM | POA: Diagnosis not present

## 2017-07-14 DIAGNOSIS — H6123 Impacted cerumen, bilateral: Secondary | ICD-10-CM | POA: Diagnosis not present

## 2017-08-20 DIAGNOSIS — M542 Cervicalgia: Secondary | ICD-10-CM | POA: Diagnosis not present

## 2017-08-20 DIAGNOSIS — R69 Illness, unspecified: Secondary | ICD-10-CM | POA: Diagnosis not present

## 2017-08-20 DIAGNOSIS — R1313 Dysphagia, pharyngeal phase: Secondary | ICD-10-CM | POA: Diagnosis not present

## 2017-08-20 DIAGNOSIS — M4120 Other idiopathic scoliosis, site unspecified: Secondary | ICD-10-CM | POA: Diagnosis not present

## 2017-11-19 DIAGNOSIS — K0889 Other specified disorders of teeth and supporting structures: Secondary | ICD-10-CM | POA: Diagnosis not present

## 2017-11-19 DIAGNOSIS — M542 Cervicalgia: Secondary | ICD-10-CM | POA: Diagnosis not present

## 2017-11-19 DIAGNOSIS — M4120 Other idiopathic scoliosis, site unspecified: Secondary | ICD-10-CM | POA: Diagnosis not present

## 2017-11-19 DIAGNOSIS — R69 Illness, unspecified: Secondary | ICD-10-CM | POA: Diagnosis not present

## 2017-11-28 DIAGNOSIS — M542 Cervicalgia: Secondary | ICD-10-CM | POA: Diagnosis not present

## 2017-11-28 DIAGNOSIS — R69 Illness, unspecified: Secondary | ICD-10-CM | POA: Diagnosis not present

## 2017-11-28 DIAGNOSIS — M4120 Other idiopathic scoliosis, site unspecified: Secondary | ICD-10-CM | POA: Diagnosis not present

## 2018-02-18 DIAGNOSIS — R69 Illness, unspecified: Secondary | ICD-10-CM | POA: Diagnosis not present

## 2018-02-18 DIAGNOSIS — L309 Dermatitis, unspecified: Secondary | ICD-10-CM | POA: Diagnosis not present

## 2018-02-18 DIAGNOSIS — H6123 Impacted cerumen, bilateral: Secondary | ICD-10-CM | POA: Diagnosis not present

## 2018-02-18 DIAGNOSIS — M4120 Other idiopathic scoliosis, site unspecified: Secondary | ICD-10-CM | POA: Diagnosis not present

## 2018-02-18 DIAGNOSIS — M542 Cervicalgia: Secondary | ICD-10-CM | POA: Diagnosis not present

## 2018-03-04 DIAGNOSIS — M542 Cervicalgia: Secondary | ICD-10-CM | POA: Diagnosis not present

## 2018-03-04 DIAGNOSIS — M4120 Other idiopathic scoliosis, site unspecified: Secondary | ICD-10-CM | POA: Diagnosis not present

## 2018-03-04 DIAGNOSIS — J019 Acute sinusitis, unspecified: Secondary | ICD-10-CM | POA: Diagnosis not present

## 2018-03-04 DIAGNOSIS — R69 Illness, unspecified: Secondary | ICD-10-CM | POA: Diagnosis not present

## 2018-03-04 DIAGNOSIS — H6123 Impacted cerumen, bilateral: Secondary | ICD-10-CM | POA: Diagnosis not present

## 2018-03-04 DIAGNOSIS — L309 Dermatitis, unspecified: Secondary | ICD-10-CM | POA: Diagnosis not present

## 2018-03-04 DIAGNOSIS — J028 Acute pharyngitis due to other specified organisms: Secondary | ICD-10-CM | POA: Diagnosis not present

## 2018-03-31 DIAGNOSIS — H6123 Impacted cerumen, bilateral: Secondary | ICD-10-CM | POA: Diagnosis not present

## 2018-05-20 DIAGNOSIS — R69 Illness, unspecified: Secondary | ICD-10-CM | POA: Diagnosis not present

## 2018-05-20 DIAGNOSIS — M542 Cervicalgia: Secondary | ICD-10-CM | POA: Diagnosis not present

## 2018-05-20 DIAGNOSIS — L309 Dermatitis, unspecified: Secondary | ICD-10-CM | POA: Diagnosis not present

## 2018-05-20 DIAGNOSIS — M4120 Other idiopathic scoliosis, site unspecified: Secondary | ICD-10-CM | POA: Diagnosis not present

## 2018-05-20 DIAGNOSIS — J028 Acute pharyngitis due to other specified organisms: Secondary | ICD-10-CM | POA: Diagnosis not present

## 2018-06-03 DIAGNOSIS — L309 Dermatitis, unspecified: Secondary | ICD-10-CM | POA: Diagnosis not present

## 2018-06-03 DIAGNOSIS — M542 Cervicalgia: Secondary | ICD-10-CM | POA: Diagnosis not present

## 2018-06-03 DIAGNOSIS — M4120 Other idiopathic scoliosis, site unspecified: Secondary | ICD-10-CM | POA: Diagnosis not present

## 2018-06-03 DIAGNOSIS — K0889 Other specified disorders of teeth and supporting structures: Secondary | ICD-10-CM | POA: Diagnosis not present

## 2018-06-03 DIAGNOSIS — R69 Illness, unspecified: Secondary | ICD-10-CM | POA: Diagnosis not present

## 2018-06-24 DIAGNOSIS — M21962 Unspecified acquired deformity of left lower leg: Secondary | ICD-10-CM | POA: Diagnosis not present

## 2018-06-24 DIAGNOSIS — M21961 Unspecified acquired deformity of right lower leg: Secondary | ICD-10-CM | POA: Diagnosis not present

## 2018-06-24 DIAGNOSIS — M4120 Other idiopathic scoliosis, site unspecified: Secondary | ICD-10-CM | POA: Diagnosis not present

## 2018-06-24 DIAGNOSIS — M25572 Pain in left ankle and joints of left foot: Secondary | ICD-10-CM | POA: Diagnosis not present

## 2018-06-24 DIAGNOSIS — L309 Dermatitis, unspecified: Secondary | ICD-10-CM | POA: Diagnosis not present

## 2018-06-24 DIAGNOSIS — R69 Illness, unspecified: Secondary | ICD-10-CM | POA: Diagnosis not present

## 2018-07-01 DIAGNOSIS — M25572 Pain in left ankle and joints of left foot: Secondary | ICD-10-CM | POA: Diagnosis not present

## 2018-07-01 DIAGNOSIS — M79671 Pain in right foot: Secondary | ICD-10-CM | POA: Diagnosis not present

## 2018-07-01 DIAGNOSIS — M7662 Achilles tendinitis, left leg: Secondary | ICD-10-CM | POA: Diagnosis not present

## 2018-07-01 DIAGNOSIS — M7661 Achilles tendinitis, right leg: Secondary | ICD-10-CM | POA: Diagnosis not present

## 2018-07-01 DIAGNOSIS — M25571 Pain in right ankle and joints of right foot: Secondary | ICD-10-CM | POA: Diagnosis not present

## 2018-07-01 DIAGNOSIS — M79672 Pain in left foot: Secondary | ICD-10-CM | POA: Diagnosis not present

## 2018-07-02 ENCOUNTER — Other Ambulatory Visit: Payer: Self-pay | Admitting: Physician Assistant

## 2018-08-21 DIAGNOSIS — Z0001 Encounter for general adult medical examination with abnormal findings: Secondary | ICD-10-CM | POA: Diagnosis not present

## 2018-08-21 DIAGNOSIS — L309 Dermatitis, unspecified: Secondary | ICD-10-CM | POA: Diagnosis not present

## 2018-08-21 DIAGNOSIS — M4120 Other idiopathic scoliosis, site unspecified: Secondary | ICD-10-CM | POA: Diagnosis not present

## 2018-08-21 DIAGNOSIS — M21962 Unspecified acquired deformity of left lower leg: Secondary | ICD-10-CM | POA: Diagnosis not present

## 2018-08-21 DIAGNOSIS — J301 Allergic rhinitis due to pollen: Secondary | ICD-10-CM | POA: Diagnosis not present

## 2018-08-21 DIAGNOSIS — Z1389 Encounter for screening for other disorder: Secondary | ICD-10-CM | POA: Diagnosis not present

## 2018-08-21 DIAGNOSIS — R69 Illness, unspecified: Secondary | ICD-10-CM | POA: Diagnosis not present

## 2018-08-21 DIAGNOSIS — M21961 Unspecified acquired deformity of right lower leg: Secondary | ICD-10-CM | POA: Diagnosis not present

## 2018-09-15 DIAGNOSIS — H6123 Impacted cerumen, bilateral: Secondary | ICD-10-CM | POA: Diagnosis not present

## 2019-06-24 DIAGNOSIS — M4120 Other idiopathic scoliosis, site unspecified: Secondary | ICD-10-CM | POA: Diagnosis not present

## 2019-06-24 DIAGNOSIS — J301 Allergic rhinitis due to pollen: Secondary | ICD-10-CM | POA: Diagnosis not present

## 2019-06-24 DIAGNOSIS — L309 Dermatitis, unspecified: Secondary | ICD-10-CM | POA: Diagnosis not present

## 2019-06-24 DIAGNOSIS — R69 Illness, unspecified: Secondary | ICD-10-CM | POA: Diagnosis not present

## 2019-07-22 DIAGNOSIS — L309 Dermatitis, unspecified: Secondary | ICD-10-CM | POA: Diagnosis not present

## 2019-07-22 DIAGNOSIS — N898 Other specified noninflammatory disorders of vagina: Secondary | ICD-10-CM | POA: Diagnosis not present

## 2019-07-22 DIAGNOSIS — R69 Illness, unspecified: Secondary | ICD-10-CM | POA: Diagnosis not present

## 2019-07-22 DIAGNOSIS — M4120 Other idiopathic scoliosis, site unspecified: Secondary | ICD-10-CM | POA: Diagnosis not present

## 2019-07-22 DIAGNOSIS — J301 Allergic rhinitis due to pollen: Secondary | ICD-10-CM | POA: Diagnosis not present

## 2019-08-10 DIAGNOSIS — H6123 Impacted cerumen, bilateral: Secondary | ICD-10-CM | POA: Diagnosis not present

## 2019-08-10 DIAGNOSIS — J301 Allergic rhinitis due to pollen: Secondary | ICD-10-CM | POA: Diagnosis not present

## 2019-08-10 DIAGNOSIS — L309 Dermatitis, unspecified: Secondary | ICD-10-CM | POA: Diagnosis not present

## 2019-08-10 DIAGNOSIS — R69 Illness, unspecified: Secondary | ICD-10-CM | POA: Diagnosis not present

## 2019-08-10 DIAGNOSIS — M4120 Other idiopathic scoliosis, site unspecified: Secondary | ICD-10-CM | POA: Diagnosis not present

## 2019-08-25 DIAGNOSIS — L309 Dermatitis, unspecified: Secondary | ICD-10-CM | POA: Diagnosis not present

## 2019-08-25 DIAGNOSIS — M4120 Other idiopathic scoliosis, site unspecified: Secondary | ICD-10-CM | POA: Diagnosis not present

## 2019-08-25 DIAGNOSIS — J301 Allergic rhinitis due to pollen: Secondary | ICD-10-CM | POA: Diagnosis not present

## 2019-08-25 DIAGNOSIS — Z0001 Encounter for general adult medical examination with abnormal findings: Secondary | ICD-10-CM | POA: Diagnosis not present

## 2019-08-25 DIAGNOSIS — R69 Illness, unspecified: Secondary | ICD-10-CM | POA: Diagnosis not present

## 2019-09-06 DIAGNOSIS — H6123 Impacted cerumen, bilateral: Secondary | ICD-10-CM | POA: Diagnosis not present

## 2019-10-08 DIAGNOSIS — R69 Illness, unspecified: Secondary | ICD-10-CM | POA: Diagnosis not present

## 2019-10-08 DIAGNOSIS — K219 Gastro-esophageal reflux disease without esophagitis: Secondary | ICD-10-CM | POA: Diagnosis not present

## 2019-10-08 DIAGNOSIS — J301 Allergic rhinitis due to pollen: Secondary | ICD-10-CM | POA: Diagnosis not present

## 2019-10-08 DIAGNOSIS — M4120 Other idiopathic scoliosis, site unspecified: Secondary | ICD-10-CM | POA: Diagnosis not present

## 2019-10-08 DIAGNOSIS — L309 Dermatitis, unspecified: Secondary | ICD-10-CM | POA: Diagnosis not present

## 2019-10-15 DIAGNOSIS — J301 Allergic rhinitis due to pollen: Secondary | ICD-10-CM | POA: Diagnosis not present

## 2019-10-15 DIAGNOSIS — M4120 Other idiopathic scoliosis, site unspecified: Secondary | ICD-10-CM | POA: Diagnosis not present

## 2019-10-15 DIAGNOSIS — L309 Dermatitis, unspecified: Secondary | ICD-10-CM | POA: Diagnosis not present

## 2019-10-15 DIAGNOSIS — K219 Gastro-esophageal reflux disease without esophagitis: Secondary | ICD-10-CM | POA: Diagnosis not present

## 2019-10-15 DIAGNOSIS — R300 Dysuria: Secondary | ICD-10-CM | POA: Diagnosis not present

## 2019-10-15 DIAGNOSIS — R69 Illness, unspecified: Secondary | ICD-10-CM | POA: Diagnosis not present

## 2019-11-08 DIAGNOSIS — L309 Dermatitis, unspecified: Secondary | ICD-10-CM | POA: Diagnosis not present

## 2019-11-08 DIAGNOSIS — R69 Illness, unspecified: Secondary | ICD-10-CM | POA: Diagnosis not present

## 2019-11-08 DIAGNOSIS — K219 Gastro-esophageal reflux disease without esophagitis: Secondary | ICD-10-CM | POA: Diagnosis not present

## 2019-11-08 DIAGNOSIS — J301 Allergic rhinitis due to pollen: Secondary | ICD-10-CM | POA: Diagnosis not present

## 2019-11-08 DIAGNOSIS — M4120 Other idiopathic scoliosis, site unspecified: Secondary | ICD-10-CM | POA: Diagnosis not present

## 2019-11-08 DIAGNOSIS — N898 Other specified noninflammatory disorders of vagina: Secondary | ICD-10-CM | POA: Diagnosis not present

## 2019-11-09 DIAGNOSIS — N898 Other specified noninflammatory disorders of vagina: Secondary | ICD-10-CM | POA: Diagnosis not present

## 2019-11-17 DIAGNOSIS — J301 Allergic rhinitis due to pollen: Secondary | ICD-10-CM | POA: Diagnosis not present

## 2019-11-17 DIAGNOSIS — N898 Other specified noninflammatory disorders of vagina: Secondary | ICD-10-CM | POA: Diagnosis not present

## 2019-11-17 DIAGNOSIS — E559 Vitamin D deficiency, unspecified: Secondary | ICD-10-CM | POA: Diagnosis not present

## 2019-11-17 DIAGNOSIS — K219 Gastro-esophageal reflux disease without esophagitis: Secondary | ICD-10-CM | POA: Diagnosis not present

## 2019-11-17 DIAGNOSIS — M4120 Other idiopathic scoliosis, site unspecified: Secondary | ICD-10-CM | POA: Diagnosis not present

## 2019-11-17 DIAGNOSIS — R69 Illness, unspecified: Secondary | ICD-10-CM | POA: Diagnosis not present

## 2019-11-17 DIAGNOSIS — R519 Headache, unspecified: Secondary | ICD-10-CM | POA: Diagnosis not present

## 2019-12-22 DIAGNOSIS — Q971 Female with more than three X chromosomes: Secondary | ICD-10-CM | POA: Diagnosis not present

## 2019-12-22 DIAGNOSIS — G4489 Other headache syndrome: Secondary | ICD-10-CM | POA: Diagnosis not present

## 2020-03-23 DIAGNOSIS — M4120 Other idiopathic scoliosis, site unspecified: Secondary | ICD-10-CM | POA: Diagnosis not present

## 2020-03-23 DIAGNOSIS — K219 Gastro-esophageal reflux disease without esophagitis: Secondary | ICD-10-CM | POA: Diagnosis not present

## 2020-03-23 DIAGNOSIS — R519 Headache, unspecified: Secondary | ICD-10-CM | POA: Diagnosis not present

## 2020-03-23 DIAGNOSIS — E559 Vitamin D deficiency, unspecified: Secondary | ICD-10-CM | POA: Diagnosis not present

## 2020-03-23 DIAGNOSIS — N898 Other specified noninflammatory disorders of vagina: Secondary | ICD-10-CM | POA: Diagnosis not present

## 2020-03-23 DIAGNOSIS — R69 Illness, unspecified: Secondary | ICD-10-CM | POA: Diagnosis not present

## 2020-03-23 DIAGNOSIS — J301 Allergic rhinitis due to pollen: Secondary | ICD-10-CM | POA: Diagnosis not present

## 2020-03-29 DIAGNOSIS — G4489 Other headache syndrome: Secondary | ICD-10-CM | POA: Diagnosis not present

## 2020-04-12 DIAGNOSIS — G4489 Other headache syndrome: Secondary | ICD-10-CM | POA: Diagnosis not present

## 2020-04-12 DIAGNOSIS — Q971 Female with more than three X chromosomes: Secondary | ICD-10-CM | POA: Diagnosis not present

## 2020-05-08 DIAGNOSIS — R69 Illness, unspecified: Secondary | ICD-10-CM | POA: Diagnosis not present

## 2020-05-08 DIAGNOSIS — J301 Allergic rhinitis due to pollen: Secondary | ICD-10-CM | POA: Diagnosis not present

## 2020-05-08 DIAGNOSIS — K219 Gastro-esophageal reflux disease without esophagitis: Secondary | ICD-10-CM | POA: Diagnosis not present

## 2020-05-08 DIAGNOSIS — E559 Vitamin D deficiency, unspecified: Secondary | ICD-10-CM | POA: Diagnosis not present

## 2020-05-08 DIAGNOSIS — K5909 Other constipation: Secondary | ICD-10-CM | POA: Diagnosis not present

## 2020-05-08 DIAGNOSIS — R519 Headache, unspecified: Secondary | ICD-10-CM | POA: Diagnosis not present

## 2020-05-08 DIAGNOSIS — M4120 Other idiopathic scoliosis, site unspecified: Secondary | ICD-10-CM | POA: Diagnosis not present

## 2020-05-11 ENCOUNTER — Encounter (HOSPITAL_COMMUNITY): Payer: Self-pay

## 2020-05-11 ENCOUNTER — Other Ambulatory Visit: Payer: Self-pay

## 2020-05-11 ENCOUNTER — Emergency Department (HOSPITAL_COMMUNITY)
Admission: EM | Admit: 2020-05-11 | Discharge: 2020-05-11 | Disposition: A | Discharge status: Home / Self Care | Payer: Medicare HMO | Attending: Emergency Medicine | Admitting: Emergency Medicine

## 2020-05-11 DIAGNOSIS — R69 Illness, unspecified: Secondary | ICD-10-CM | POA: Diagnosis not present

## 2020-05-11 DIAGNOSIS — R1084 Generalized abdominal pain: Secondary | ICD-10-CM | POA: Diagnosis not present

## 2020-05-11 DIAGNOSIS — R112 Nausea with vomiting, unspecified: Secondary | ICD-10-CM | POA: Diagnosis not present

## 2020-05-11 DIAGNOSIS — Z743 Need for continuous supervision: Secondary | ICD-10-CM | POA: Diagnosis not present

## 2020-05-11 DIAGNOSIS — R109 Unspecified abdominal pain: Secondary | ICD-10-CM | POA: Diagnosis not present

## 2020-05-11 MED ORDER — ONDANSETRON HCL 4 MG/2ML IJ SOLN: 4.0000 mg | Freq: Once | INTRAMUSCULAR | Status: DC

## 2020-05-11 MED ORDER — KETOROLAC TROMETHAMINE 15 MG/ML IJ SOLN: 15.0000 mg | Freq: Once | INTRAMUSCULAR | Status: DC

## 2020-05-11 MED ORDER — SODIUM CHLORIDE 0.9 % IV BOLUS: 1000.0000 mL | Freq: Once | INTRAVENOUS | Status: DC

## 2020-05-11 MED ORDER — SODIUM CHLORIDE 0.9 % IV SOLN: INTRAVENOUS | Status: DC

## 2020-05-11 NOTE — ED Notes (Signed)
Patient Alert and oriented to baseline. Stable and ambulatory to baseline. Patient verbalized understanding of the discharge instructions.  Patient belongings were taken by the patient.   

## 2020-05-11 NOTE — Discharge Instructions (Addendum)
Do not hesitate to return with her daughter should she develop new, or concerning changes in her condition.  Otherwise follow-up with her physician as scheduled next week.
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
## 2020-05-11 NOTE — ED Notes (Signed)
Attempted to place an IV and draw labs with no success. Primary RN notified.
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
## 2020-05-15 ENCOUNTER — Other Ambulatory Visit: Payer: Self-pay

## 2020-05-15 ENCOUNTER — Encounter (HOSPITAL_BASED_OUTPATIENT_CLINIC_OR_DEPARTMENT_OTHER): Payer: Self-pay | Admitting: *Deleted

## 2020-05-15 ENCOUNTER — Emergency Department (HOSPITAL_BASED_OUTPATIENT_CLINIC_OR_DEPARTMENT_OTHER): Payer: Medicare HMO

## 2020-05-15 ENCOUNTER — Emergency Department (HOSPITAL_BASED_OUTPATIENT_CLINIC_OR_DEPARTMENT_OTHER)
Admission: EM | Admit: 2020-05-15 | Discharge: 2020-05-15 | Disposition: A | Discharge status: Home / Self Care | Payer: Medicare HMO | Attending: Emergency Medicine | Admitting: Emergency Medicine

## 2020-05-15 DIAGNOSIS — R69 Illness, unspecified: Secondary | ICD-10-CM | POA: Diagnosis not present

## 2020-05-15 DIAGNOSIS — M4120 Other idiopathic scoliosis, site unspecified: Secondary | ICD-10-CM | POA: Diagnosis not present

## 2020-05-15 DIAGNOSIS — R1033 Periumbilical pain: Secondary | ICD-10-CM | POA: Diagnosis not present

## 2020-05-15 DIAGNOSIS — R1110 Vomiting, unspecified: Secondary | ICD-10-CM | POA: Diagnosis not present

## 2020-05-15 DIAGNOSIS — K219 Gastro-esophageal reflux disease without esophagitis: Secondary | ICD-10-CM | POA: Diagnosis not present

## 2020-05-15 DIAGNOSIS — E559 Vitamin D deficiency, unspecified: Secondary | ICD-10-CM | POA: Diagnosis not present

## 2020-05-15 DIAGNOSIS — K5900 Constipation, unspecified: Secondary | ICD-10-CM | POA: Diagnosis not present

## 2020-05-15 DIAGNOSIS — R519 Headache, unspecified: Secondary | ICD-10-CM | POA: Diagnosis not present

## 2020-05-15 DIAGNOSIS — N23 Unspecified renal colic: Secondary | ICD-10-CM | POA: Insufficient documentation

## 2020-05-15 DIAGNOSIS — J301 Allergic rhinitis due to pollen: Secondary | ICD-10-CM | POA: Diagnosis not present

## 2020-05-15 DIAGNOSIS — R109 Unspecified abdominal pain: Secondary | ICD-10-CM | POA: Diagnosis not present

## 2020-05-15 DIAGNOSIS — N200 Calculus of kidney: Secondary | ICD-10-CM

## 2020-05-15 DIAGNOSIS — N898 Other specified noninflammatory disorders of vagina: Secondary | ICD-10-CM | POA: Diagnosis not present

## 2020-05-15 DIAGNOSIS — R1030 Lower abdominal pain, unspecified: Secondary | ICD-10-CM | POA: Diagnosis not present

## 2020-05-15 LAB — CBC
HCT: 39.1 % (ref 36.0–46.0)
Hemoglobin: 12.7 g/dL (ref 12.0–15.0)
MCH: 30 pg (ref 26.0–34.0)
MCHC: 32.5 g/dL (ref 30.0–36.0)
MCV: 92.4 fL (ref 80.0–100.0)
Platelets: 167 10*3/uL (ref 150–400)
RBC: 4.23 MIL/uL (ref 3.87–5.11)
RDW: 13.5 % (ref 11.5–15.5)
WBC: 7 10*3/uL (ref 4.0–10.5)
nRBC: 0 % (ref 0.0–0.2)

## 2020-05-15 LAB — LIPASE, BLOOD: Lipase: 30 U/L (ref 11–51)

## 2020-05-15 LAB — COMPREHENSIVE METABOLIC PANEL
ALT: 21 U/L (ref 0–44)
AST: 23 U/L (ref 15–41)
Albumin: 4.2 g/dL (ref 3.5–5.0)
Alkaline Phosphatase: 111 U/L (ref 38–126)
Anion gap: 10 (ref 5–15)
BUN: 11 mg/dL (ref 6–20)
CO2: 30 mmol/L (ref 22–32)
Calcium: 9.7 mg/dL (ref 8.9–10.3)
Chloride: 101 mmol/L (ref 98–111)
Creatinine, Ser: 0.73 mg/dL (ref 0.44–1.00)
GFR, Estimated: >60 mL/min (ref >60)
Glucose, Bld: 110 mg/dL — ABNORMAL HIGH (ref 70–99)
Potassium: 3.8 mmol/L (ref 3.5–5.1)
Sodium: 141 mmol/L (ref 135–145)
Total Bilirubin: 0.6 mg/dL (ref 0.3–1.2)
Total Protein: 7.5 g/dL (ref 6.5–8.1)

## 2020-05-15 LAB — URINALYSIS, ROUTINE W REFLEX MICROSCOPIC
Bilirubin Urine: NEGATIVE
Glucose, UA: NEGATIVE mg/dL
Ketones, ur: NEGATIVE mg/dL
Leukocytes,Ua: NEGATIVE
Nitrite: NEGATIVE
Protein, ur: NEGATIVE mg/dL
Specific Gravity, Urine: <1.005 — ABNORMAL LOW (ref 1.005–1.030)
pH: 7 (ref 5.0–8.0)

## 2020-05-15 LAB — URINALYSIS, MICROSCOPIC (REFLEX): WBC, UA: NONE SEEN WBC/hpf (ref 0–5)

## 2020-05-15 LAB — PREGNANCY, URINE: Preg Test, Ur: NEGATIVE

## 2020-05-15 MED ORDER — OXYCODONE HCL 5 MG PO TABS: 5.0000 mg | ORAL_TABLET | ORAL | 0 refills | Status: AC | PRN

## 2020-05-15 MED ORDER — ONDANSETRON HCL 4 MG/2ML IJ SOLN
4.0000 mg | Freq: Once | INTRAMUSCULAR | Status: AC
  Administered 2020-05-15: 4 mg via INTRAVENOUS
  Filled 2020-05-15: qty 2

## 2020-05-15 MED ORDER — IOHEXOL 300 MG/ML  SOLN
100.0000 mL | Freq: Once | INTRAMUSCULAR | Status: AC | PRN
  Administered 2020-05-15: 100 mL via INTRAVENOUS

## 2020-05-15 MED ORDER — KETOROLAC TROMETHAMINE 30 MG/ML IJ SOLN
30.0000 mg | Freq: Once | INTRAMUSCULAR | Status: AC
  Administered 2020-05-15: 30 mg via INTRAVENOUS
  Filled 2020-05-15: qty 1

## 2020-05-15 MED ORDER — TAMSULOSIN HCL 0.4 MG PO CAPS: 0.4000 mg | ORAL_CAPSULE | Freq: Every day | ORAL | 0 refills | Status: AC

## 2020-05-15 MED ORDER — ONDANSETRON HCL 4 MG PO TABS: 4.0000 mg | ORAL_TABLET | Freq: Four times a day (QID) | ORAL | 0 refills | Status: AC

## 2020-05-15 MED ORDER — SODIUM CHLORIDE 0.9 % IV BOLUS
1000.0000 mL | Freq: Once | INTRAVENOUS | Status: AC
  Administered 2020-05-15: 1000 mL via INTRAVENOUS

## 2020-05-15 NOTE — Discharge Instructions (Signed)
You have been diagnosed with a kidney stone.  Follow-up with urology Dr. Arita Miss.  Please return if you develop fever, worsening pain as discussed.  Recommend 600 mg of ibuprofen every 8 hours for pain.  Recommend 1000 mg of Tylenol every 6 hours as needed for pain use Roxicodone for breakthrough pain.  Be careful with this medication as it is a narcotic pain medicine and can be heavily sedating.  Please use as prescribed and use this only for breakthrough pain.  Recommend using MiraLAX while taking this medicine as it can cause constipation.

## 2020-05-15 NOTE — ED Triage Notes (Signed)
Constipation. Abdominal pain. Vomiting x 1 this am. She was seen by her MD this am and told to come here.

## 2020-05-15 NOTE — ED Notes (Signed)
ambulatory to bathroom

## 2020-05-15 NOTE — ED Provider Notes (Addendum)
[facility] EMERGENCY DEPARTMENT Provider Note   CSN: [id number] Arrival date & time: 05/15/20  1601     History Chief Complaint  Patient presents with  . Constipation    Cindy Vaughan is a 30 y.o. female.  Patient with history of Penta X syndrome.  Having some lower abdominal pain.  Episode of nausea and vomiting.  History of UTIs.  The history is provided by the patient.  Abdominal Pain Pain location:  Suprapubic Pain quality: aching   Pain radiates to:  Does not radiate Pain severity:  Mild Onset quality:  Gradual Timing:  Intermittent Progression:  Waxing and waning Chronicity:  New Relieved by:  Nothing Worsened by:  Nothing Associated symptoms: no chest pain, no chills, no cough, no dysuria, no fever, no hematuria, no shortness of breath, no sore throat and no vomiting   Risk factors: has not had multiple surgeries        Past Medical History:  Diagnosis Date  . PDA (patent ductus arteriosus)   . Penta X syndrome     Patient Active Problem List   Diagnosis Date Noted  . Acquired hallux valgus 01/01/2016  . Hammer toes of both feet 01/01/2016  . Congenital pes planus 01/01/2016    Past Surgical History:  Procedure Laterality Date  . LEG SURGERY       OB History   No obstetric history on file.     No family history on file.  Social History   Tobacco Use  . Smoking status: Never Smoker  . Smokeless tobacco: Never Used  Substance Use Topics  . Alcohol use: No  . Drug use: No    Home Medications Prior to Admission medications   Medication Sig Start Date End Date Taking? Authorizing Provider  ondansetron (ZOFRAN) 4 MG tablet Take 1 tablet (4 mg total) by mouth every 6 (six) hours. 05/15/20  Yes Coyle Stordahl, DO  oxyCODONE (ROXICODONE) 5 MG immediate release tablet Take 1 tablet (5 mg total) by mouth every 4 (four) hours as needed for up to 15 doses for severe pain. 05/15/20  Yes Quindarius Cabello, DO  tamsulosin (FLOMAX) 0.4 MG CAPS  capsule Take 1 capsule (0.4 mg total) by mouth daily for 7 days. 05/15/20 05/22/20 Yes Cornelis Kluver, DO  CETIRIZINE HCL ALLERGY CHILD 5 MG/5ML SOLN Take 2.5 mLs by mouth daily. 04/15/20   [provider]    Allergies    Patient has no known allergies.  Review of Systems   Review of Systems  Constitutional: Negative for chills and fever.  HENT: Negative for ear pain and sore throat.   Eyes: Negative for pain and visual disturbance.  Respiratory: Negative for cough and shortness of breath.   Cardiovascular: Negative for chest pain and palpitations.  Gastrointestinal: Positive for abdominal pain. Negative for vomiting.  Genitourinary: Negative for dysuria and hematuria.  Musculoskeletal: Negative for arthralgias and back pain.  Skin: Negative for color change and rash.  Neurological: Negative for seizures and syncope.  All other systems reviewed and are negative.   Physical Exam Updated Vital Signs  ED Triage Vitals  Enc Vitals Group     BP 05/15/20 1634 111/79     Pulse Rate 05/15/20 1634 78     Resp 05/15/20 1634 18     Temp 05/15/20 1634 98.4 F (36.9 C)     Temp Source 05/15/20 1634 Oral     SpO2 05/15/20 1634 96 %     Weight 05/15/20 1629 134 lb 14.7  oz (61.2 kg)     Height 05/15/20 1629 6' (1.829 m)     Head Circumference --      Peak Flow --      Pain Score 05/15/20 1628 5     Pain Loc --      Pain Edu? --      Excl. in GC? --     Physical Exam Vitals and nursing note reviewed.  Constitutional:      General: She is not in acute distress.    Appearance: She is well-developed. She is not ill-appearing.  HENT:     Head: Normocephalic and atraumatic.     Nose: Nose normal.  Eyes:     Extraocular Movements: Extraocular movements intact.     Conjunctiva/sclera: Conjunctivae normal.     Pupils: Pupils are equal, round, and reactive to light.  Cardiovascular:     Rate and Rhythm: Normal rate and regular rhythm.     Heart sounds: No murmur  heard.   Pulmonary:     Effort: Pulmonary effort is normal. No respiratory distress.     Breath sounds: Normal breath sounds.  Abdominal:     Palpations: Abdomen is soft.     Tenderness: There is abdominal tenderness (suprapubic area).  Musculoskeletal:        General: Normal range of motion.     Cervical back: Normal range of motion and neck supple.  Skin:    General: Skin is warm and dry.     Capillary Refill: Capillary refill takes less than 2 seconds.  Neurological:     General: No focal deficit present.     Mental Status: She is alert.  Psychiatric:        Mood and Affect: Mood normal.     ED Results / Procedures / Treatments   Labs (all labs ordered are listed, but only abnormal results are displayed) Labs Reviewed  COMPREHENSIVE METABOLIC PANEL - Abnormal; Notable for the following components:      Result Value   Glucose, Bld 110 (*)    All other components within normal limits  URINALYSIS, ROUTINE W REFLEX MICROSCOPIC - Abnormal; Notable for the following components:   Specific Gravity, Urine <1.005 (*)    Hgb urine dipstick TRACE (*)    All other components within normal limits  URINALYSIS, MICROSCOPIC (REFLEX) - Abnormal; Notable for the following components:   Bacteria, UA RARE (*)    All other components within normal limits  LIPASE, BLOOD  CBC  PREGNANCY, URINE    EKG None  Radiology CT ABDOMEN PELVIS W CONTRAST  Result Date: 05/15/2020 CLINICAL DATA:  Abdominal pain, vomiting, constipation EXAM: CT ABDOMEN AND PELVIS WITH CONTRAST TECHNIQUE: Multidetector CT imaging of the abdomen and pelvis was performed using the standard protocol following bolus administration of intravenous contrast. CONTRAST:  OMNIPAQUE IOHEXOL 300 MG/ML  SOLN COMPARISON:  None. FINDINGS: Lower chest: No acute pleural or parenchymal lung disease. Hepatobiliary: No focal liver abnormality is seen. No gallstones, gallbladder wall thickening, or biliary dilatation. Pancreas: There  is diffuse pancreatic atrophy. No focal parenchymal abnormalities. Spleen: Normal in size without focal abnormality. Adrenals/Urinary Tract: There is left-sided obstructive uropathy due to a 6 mm obstructing distal left ureteral calculus, just proximal to the left UVJ. There is delayed excretion of contrast from the left kidney on delayed imaging. Other punctate less than 3 mm nonobstructing left renal calculi are noted. There are multiple nonobstructing right renal calculi. Largest grouping of calcifications in the lower pole right  kidney measures 6 mm. No right-sided obstruction. The adrenals and bladder are unremarkable. Stomach/Bowel: No bowel obstruction or ileus. Normal appendix right lower quadrant. No bowel wall thickening or inflammatory change. Vascular/Lymphatic: No significant vascular findings are present. No enlarged abdominal or pelvic lymph nodes. Reproductive: Uterus is markedly atrophic.  No adnexal masses. Other: No free fluid or free gas.  No abdominal wall hernia. Musculoskeletal: No acute or destructive bony lesions. There is partial congenital bony fusion at T9-10 and T10-11. Reconstructed images demonstrate no additional findings. IMPRESSION: 1. Left-sided obstructive uropathy due to a 6 mm obstructing distal left ureteral calculus. 2. Other bilateral nonobstructing renal calculi. Electronically Signed   By: Sharlet Salina M.D.   On: 05/15/2020 22:20    Procedures Procedures   Medications Ordered in ED Medications  sodium chloride 0.9 % bolus 1,000 mL (0 mLs Intravenous Stopped 05/15/20 2213)  ondansetron (ZOFRAN) injection 4 mg (4 mg Intravenous Given 05/15/20 2213)  iohexol (OMNIPAQUE) 300 MG/ML solution 100 mL (100 mLs Intravenous Contrast Given 05/15/20 2156)  ketorolac (TORADOL) 30 MG/ML injection 30 mg (30 mg Intravenous Given 05/15/20 2300)    ED Course  I have reviewed the triage vital signs and the nursing notes.  Pertinent labs & imaging results that were available during  my care of the patient were reviewed by me and considered in my medical decision making (see chart for details).    MDM Rules/Calculators/A&P                          Amen L Geck is here with lower abdominal pain.  History of genetic syndrome.  Patient with normal vitals.  No fever.  Has been having some lower abdominal pain.  Vomited once.  Could be UTI versus constipation versus may be kidney stone, bowel obstruction.  CT scan shows 6 mm left-sided kidney stone.  Urinalysis negative for infection.  No significant anemia, electrolyte ab Mody, kidney injury.  Overall uncomplicated kidney stone.  Felt better after Zofran and Toradol.  Pain medication, nausea medicine, Zofran prescribed.  Information for urology follow-up given.  Return precautions given.  Final Clinical Impression(s) / ED Diagnoses Final diagnoses:  Kidney calculi    Rx / DC Orders ED Discharge Orders         Ordered    oxyCODONE (ROXICODONE) 5 MG immediate release tablet  Every 4 hours PRN        05/15/20 2315    ondansetron (ZOFRAN) 4 MG tablet  Every 6 hours        05/15/20 2315    tamsulosin (FLOMAX) 0.4 MG CAPS capsule  Daily        05/15/20 2315           Virgina Norfolk, DO 05/15/20 2318    Virgina Norfolk, DO 05/15/20 2319

## 2020-05-29 DIAGNOSIS — M4120 Other idiopathic scoliosis, site unspecified: Secondary | ICD-10-CM | POA: Diagnosis not present

## 2020-05-29 DIAGNOSIS — R519 Headache, unspecified: Secondary | ICD-10-CM | POA: Diagnosis not present

## 2020-05-29 DIAGNOSIS — E559 Vitamin D deficiency, unspecified: Secondary | ICD-10-CM | POA: Diagnosis not present

## 2020-05-29 DIAGNOSIS — N200 Calculus of kidney: Secondary | ICD-10-CM | POA: Diagnosis not present

## 2020-05-29 DIAGNOSIS — N898 Other specified noninflammatory disorders of vagina: Secondary | ICD-10-CM | POA: Diagnosis not present

## 2020-05-29 DIAGNOSIS — K219 Gastro-esophageal reflux disease without esophagitis: Secondary | ICD-10-CM | POA: Diagnosis not present

## 2020-05-29 DIAGNOSIS — J301 Allergic rhinitis due to pollen: Secondary | ICD-10-CM | POA: Diagnosis not present

## 2020-05-29 DIAGNOSIS — R69 Illness, unspecified: Secondary | ICD-10-CM | POA: Diagnosis not present

## 2020-06-28 DIAGNOSIS — R300 Dysuria: Secondary | ICD-10-CM | POA: Diagnosis not present

## 2020-07-18 DIAGNOSIS — J301 Allergic rhinitis due to pollen: Secondary | ICD-10-CM | POA: Diagnosis not present

## 2020-07-18 DIAGNOSIS — R519 Headache, unspecified: Secondary | ICD-10-CM | POA: Diagnosis not present

## 2020-07-18 DIAGNOSIS — R69 Illness, unspecified: Secondary | ICD-10-CM | POA: Diagnosis not present

## 2020-07-18 DIAGNOSIS — K219 Gastro-esophageal reflux disease without esophagitis: Secondary | ICD-10-CM | POA: Diagnosis not present

## 2020-07-18 DIAGNOSIS — E559 Vitamin D deficiency, unspecified: Secondary | ICD-10-CM | POA: Diagnosis not present

## 2020-07-18 DIAGNOSIS — M4120 Other idiopathic scoliosis, site unspecified: Secondary | ICD-10-CM | POA: Diagnosis not present

## 2020-08-18 DIAGNOSIS — R109 Unspecified abdominal pain: Secondary | ICD-10-CM | POA: Diagnosis not present

## 2020-08-18 DIAGNOSIS — R300 Dysuria: Secondary | ICD-10-CM | POA: Diagnosis not present

## 2020-08-25 DIAGNOSIS — M4120 Other idiopathic scoliosis, site unspecified: Secondary | ICD-10-CM | POA: Diagnosis not present

## 2020-08-25 DIAGNOSIS — Z0000 Encounter for general adult medical examination without abnormal findings: Secondary | ICD-10-CM | POA: Diagnosis not present

## 2020-08-25 DIAGNOSIS — E559 Vitamin D deficiency, unspecified: Secondary | ICD-10-CM | POA: Diagnosis not present

## 2020-08-25 DIAGNOSIS — R69 Illness, unspecified: Secondary | ICD-10-CM | POA: Diagnosis not present

## 2020-08-25 DIAGNOSIS — J301 Allergic rhinitis due to pollen: Secondary | ICD-10-CM | POA: Diagnosis not present

## 2020-11-24 DIAGNOSIS — R451 Restlessness and agitation: Secondary | ICD-10-CM | POA: Diagnosis not present

## 2020-11-24 DIAGNOSIS — Z0000 Encounter for general adult medical examination without abnormal findings: Secondary | ICD-10-CM | POA: Diagnosis not present

## 2020-11-24 DIAGNOSIS — H6123 Impacted cerumen, bilateral: Secondary | ICD-10-CM | POA: Diagnosis not present

## 2020-11-24 DIAGNOSIS — R42 Dizziness and giddiness: Secondary | ICD-10-CM | POA: Diagnosis not present

## 2020-11-24 DIAGNOSIS — E559 Vitamin D deficiency, unspecified: Secondary | ICD-10-CM | POA: Diagnosis not present

## 2020-11-24 DIAGNOSIS — M4120 Other idiopathic scoliosis, site unspecified: Secondary | ICD-10-CM | POA: Diagnosis not present

## 2020-11-24 DIAGNOSIS — J301 Allergic rhinitis due to pollen: Secondary | ICD-10-CM | POA: Diagnosis not present

## 2020-11-24 DIAGNOSIS — R69 Illness, unspecified: Secondary | ICD-10-CM | POA: Diagnosis not present

## 2020-11-24 DIAGNOSIS — E611 Iron deficiency: Secondary | ICD-10-CM | POA: Diagnosis not present

## 2020-11-24 DIAGNOSIS — H539 Unspecified visual disturbance: Secondary | ICD-10-CM | POA: Diagnosis not present

## 2020-12-04 DIAGNOSIS — H6123 Impacted cerumen, bilateral: Secondary | ICD-10-CM | POA: Diagnosis not present

## 2020-12-25 DIAGNOSIS — M4120 Other idiopathic scoliosis, site unspecified: Secondary | ICD-10-CM | POA: Diagnosis not present

## 2020-12-25 DIAGNOSIS — E559 Vitamin D deficiency, unspecified: Secondary | ICD-10-CM | POA: Diagnosis not present

## 2020-12-25 DIAGNOSIS — R451 Restlessness and agitation: Secondary | ICD-10-CM | POA: Diagnosis not present

## 2020-12-25 DIAGNOSIS — J301 Allergic rhinitis due to pollen: Secondary | ICD-10-CM | POA: Diagnosis not present

## 2020-12-25 DIAGNOSIS — R42 Dizziness and giddiness: Secondary | ICD-10-CM | POA: Diagnosis not present

## 2020-12-25 DIAGNOSIS — R69 Illness, unspecified: Secondary | ICD-10-CM | POA: Diagnosis not present

## 2021-01-17 DIAGNOSIS — Q971 Female with more than three X chromosomes: Secondary | ICD-10-CM | POA: Diagnosis not present

## 2021-01-17 DIAGNOSIS — G4489 Other headache syndrome: Secondary | ICD-10-CM | POA: Diagnosis not present

## 2021-02-14 DIAGNOSIS — H5213 Myopia, bilateral: Secondary | ICD-10-CM | POA: Diagnosis not present

## 2021-02-14 DIAGNOSIS — H269 Unspecified cataract: Secondary | ICD-10-CM | POA: Diagnosis not present

## 2021-02-14 DIAGNOSIS — H52203 Unspecified astigmatism, bilateral: Secondary | ICD-10-CM | POA: Diagnosis not present

## 2021-03-12 DIAGNOSIS — E559 Vitamin D deficiency, unspecified: Secondary | ICD-10-CM | POA: Diagnosis not present

## 2021-03-12 DIAGNOSIS — J301 Allergic rhinitis due to pollen: Secondary | ICD-10-CM | POA: Diagnosis not present

## 2021-03-12 DIAGNOSIS — M4120 Other idiopathic scoliosis, site unspecified: Secondary | ICD-10-CM | POA: Diagnosis not present

## 2021-06-05 DIAGNOSIS — H6123 Impacted cerumen, bilateral: Secondary | ICD-10-CM | POA: Diagnosis not present

## 2021-06-05 DIAGNOSIS — H608X3 Other otitis externa, bilateral: Secondary | ICD-10-CM | POA: Diagnosis not present

## 2021-06-12 DIAGNOSIS — J301 Allergic rhinitis due to pollen: Secondary | ICD-10-CM | POA: Diagnosis not present

## 2021-06-12 DIAGNOSIS — R102 Pelvic and perineal pain: Secondary | ICD-10-CM | POA: Diagnosis not present

## 2021-06-12 DIAGNOSIS — E559 Vitamin D deficiency, unspecified: Secondary | ICD-10-CM | POA: Diagnosis not present

## 2021-06-12 DIAGNOSIS — K5909 Other constipation: Secondary | ICD-10-CM | POA: Diagnosis not present

## 2021-06-12 DIAGNOSIS — M4120 Other idiopathic scoliosis, site unspecified: Secondary | ICD-10-CM | POA: Diagnosis not present

## 2021-06-20 DIAGNOSIS — R102 Pelvic and perineal pain: Secondary | ICD-10-CM | POA: Diagnosis not present

## 2021-06-26 DIAGNOSIS — R109 Unspecified abdominal pain: Secondary | ICD-10-CM | POA: Diagnosis not present

## 2021-07-16 DIAGNOSIS — K5900 Constipation, unspecified: Secondary | ICD-10-CM | POA: Diagnosis not present

## 2021-07-16 DIAGNOSIS — R1013 Epigastric pain: Secondary | ICD-10-CM | POA: Diagnosis not present

## 2021-07-30 DIAGNOSIS — M4120 Other idiopathic scoliosis, site unspecified: Secondary | ICD-10-CM | POA: Diagnosis not present

## 2021-07-30 DIAGNOSIS — E559 Vitamin D deficiency, unspecified: Secondary | ICD-10-CM | POA: Diagnosis not present

## 2021-07-30 DIAGNOSIS — K5909 Other constipation: Secondary | ICD-10-CM | POA: Diagnosis not present

## 2021-07-30 DIAGNOSIS — J301 Allergic rhinitis due to pollen: Secondary | ICD-10-CM | POA: Diagnosis not present

## 2021-08-29 DIAGNOSIS — J329 Chronic sinusitis, unspecified: Secondary | ICD-10-CM | POA: Diagnosis not present

## 2021-08-29 DIAGNOSIS — J301 Allergic rhinitis due to pollen: Secondary | ICD-10-CM | POA: Diagnosis not present

## 2021-08-29 DIAGNOSIS — E559 Vitamin D deficiency, unspecified: Secondary | ICD-10-CM | POA: Diagnosis not present

## 2021-08-29 DIAGNOSIS — M4120 Other idiopathic scoliosis, site unspecified: Secondary | ICD-10-CM | POA: Diagnosis not present

## 2021-08-29 DIAGNOSIS — K5909 Other constipation: Secondary | ICD-10-CM | POA: Diagnosis not present

## 2021-08-29 DIAGNOSIS — Z0001 Encounter for general adult medical examination with abnormal findings: Secondary | ICD-10-CM | POA: Diagnosis not present

## 2021-09-24 IMAGING — CT CT ABD-PELV W/ CM
2 of 4 series · 15 of 46 positions shown, 17 images · IV contrast (omnipaque)
Comparison: None.

CLINICAL DATA: Abdominal pain, vomiting, constipation

EXAM:
CT ABDOMEN AND PELVIS WITH CONTRAST
TECHNIQUE: Multidetector CT imaging of the abdomen and pelvis was performed
using the standard protocol following bolus administration of
intravenous contrast.
CONTRAST:  100mL OMNIPAQUE IOHEXOL 300 MG/ML  SOLN

[Series 3: axial st · axial · 0.66mm/px · z∈[-496,-76]mm · 12 of 100 slices shown, 14 images]
[im 8/100  soft-tissue]
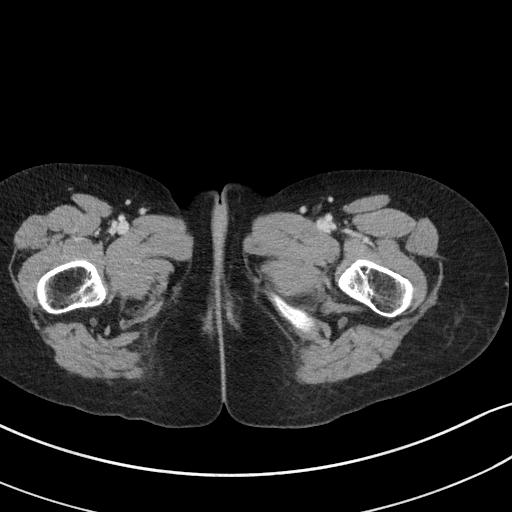
[im 8/100  bone]
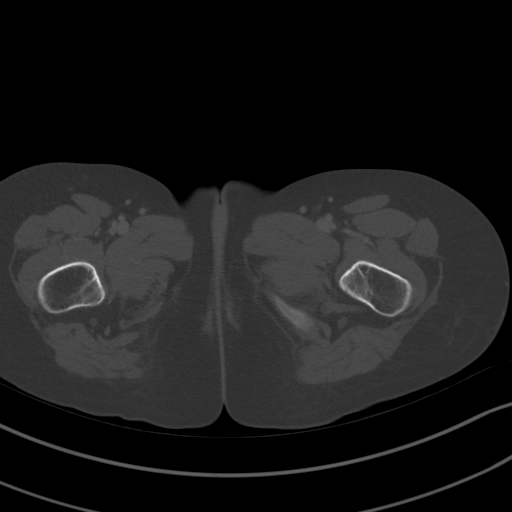
[im 16/100  soft-tissue]
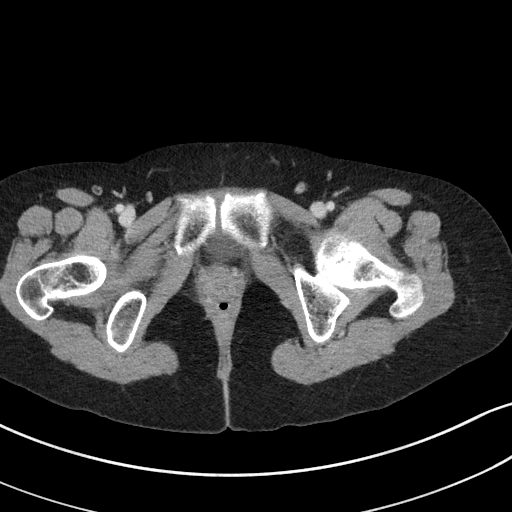
[im 23/100  soft-tissue]
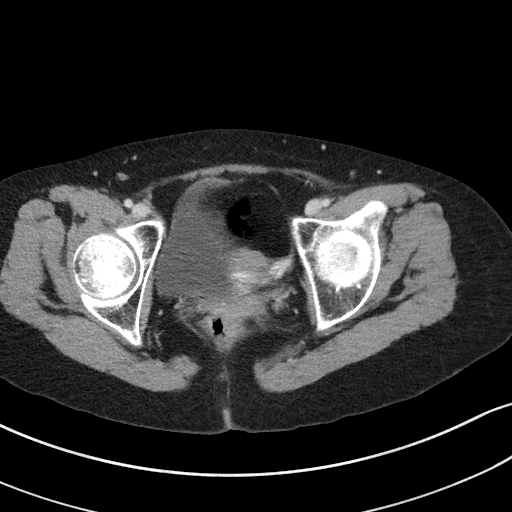
[im 31/100  soft-tissue]
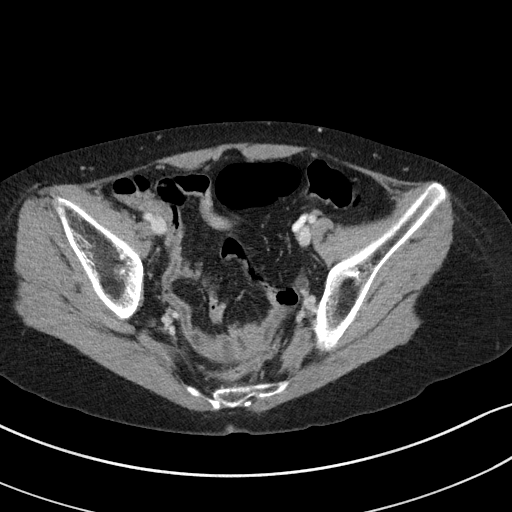
[im 39/100  soft-tissue]
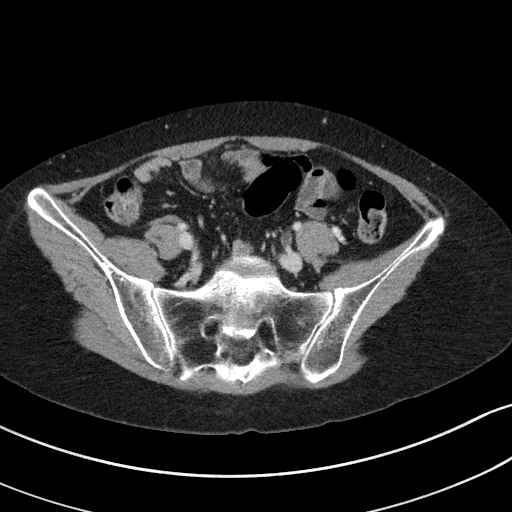
[im 46/100  soft-tissue]
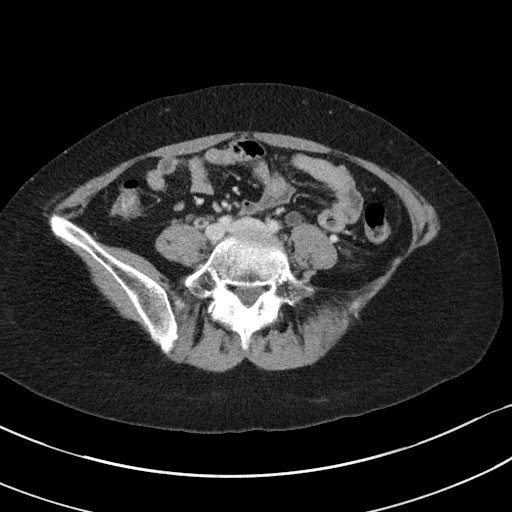
[im 54/100  soft-tissue]
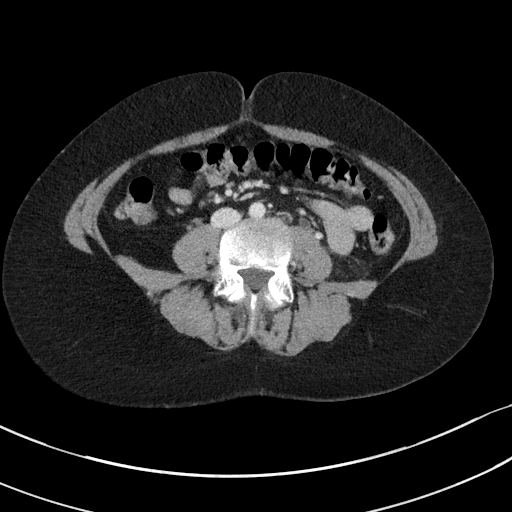
[im 61/100  soft-tissue]
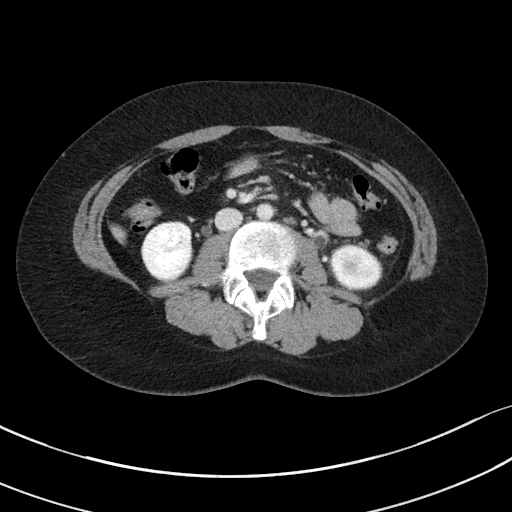
[im 69/100  soft-tissue]
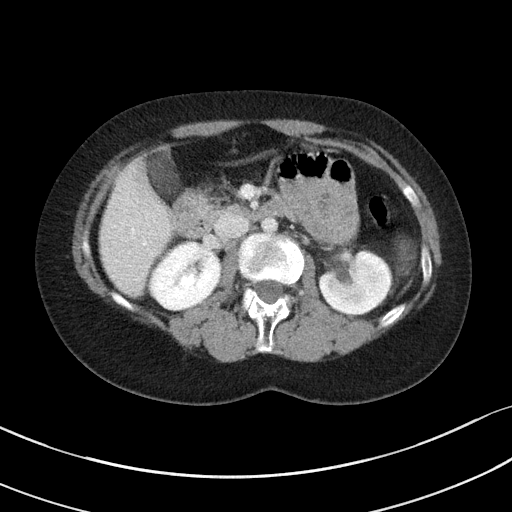
[im 69/100  bone]
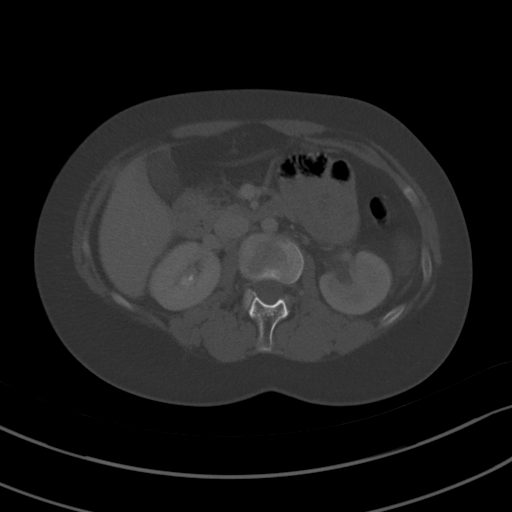
[im 77/100  soft-tissue]
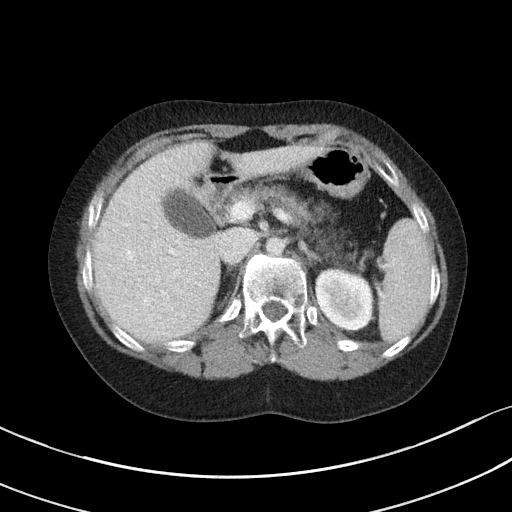
[im 84/100  soft-tissue]
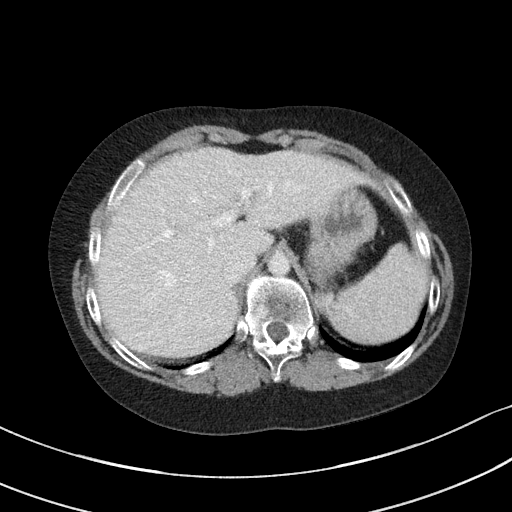
[im 92/100  soft-tissue]
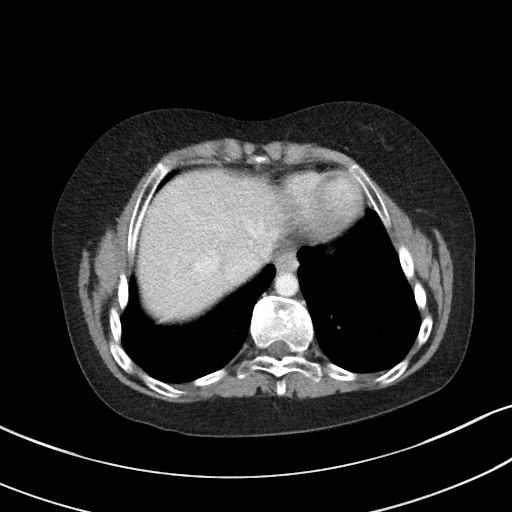

[Series 6: coronal st · coronal · 0.70mm/px · 3 of 75 slices shown]
[im 25/75  soft-tissue]
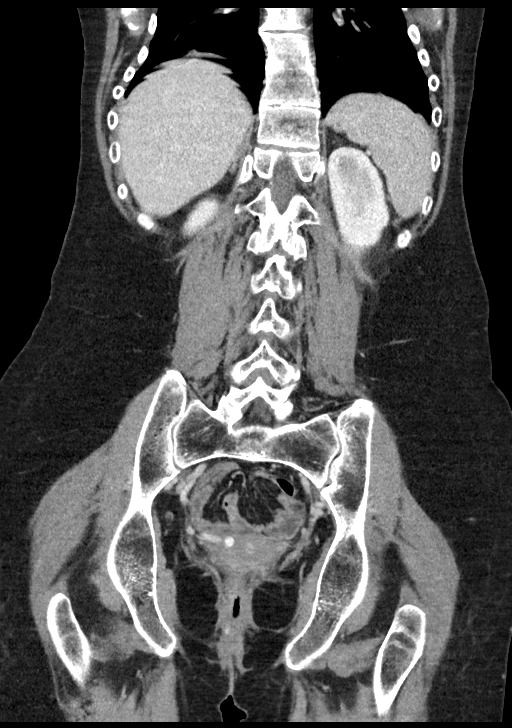
[im 33/75  soft-tissue]
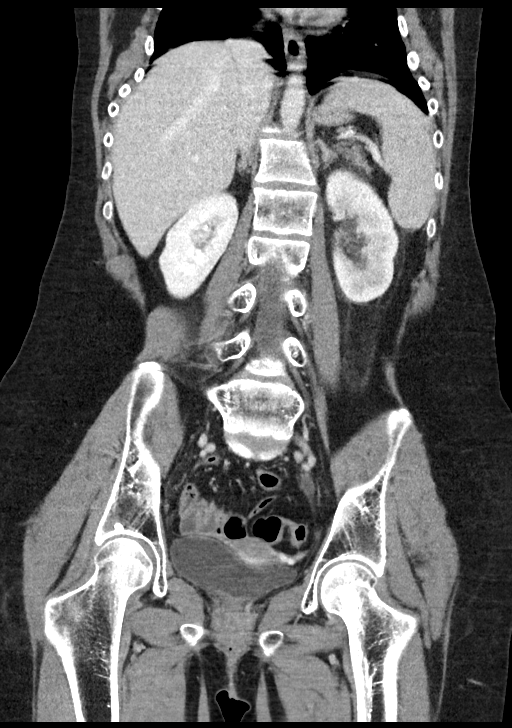
[im 42/75  soft-tissue]
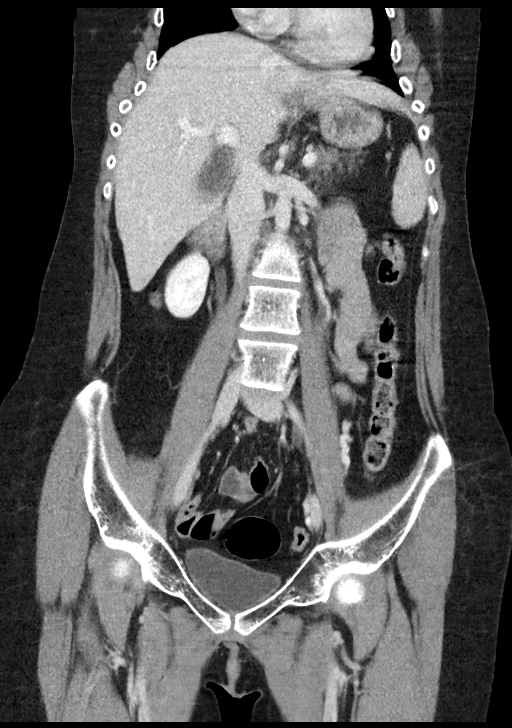

[15 of 46 positions shown; findings below may reference images not displayed]

FINDINGS: Lower chest: No acute pleural or parenchymal lung disease.

Hepatobiliary: No focal liver abnormality is seen. No gallstones,
gallbladder wall thickening, or biliary dilatation.

Pancreas: There is diffuse pancreatic atrophy. No focal parenchymal
abnormalities.

Spleen: Normal in size without focal abnormality.

Adrenals/Urinary Tract: There is left-sided obstructive uropathy due
to a 6 mm obstructing distal left ureteral calculus, just proximal
to the left UVJ. There is delayed excretion of contrast from the
left kidney on delayed imaging. Other punctate less than 3 mm
nonobstructing left renal calculi are noted.

There are multiple nonobstructing right renal calculi. Largest
grouping of calcifications in the lower pole right kidney measures 6
mm. No right-sided obstruction.

The adrenals and bladder are unremarkable.

Stomach/Bowel: No bowel obstruction or ileus. Normal appendix right
lower quadrant. No bowel wall thickening or inflammatory change.

Vascular/Lymphatic: No significant vascular findings are present. No
enlarged abdominal or pelvic lymph nodes.

Reproductive: Uterus is markedly atrophic.  No adnexal masses.

Other: No free fluid or free gas.  No abdominal wall hernia.

Musculoskeletal: No acute or destructive bony lesions. There is
partial congenital bony fusion at T9-10 and T10-11. Reconstructed
images demonstrate no additional findings.
IMPRESSION: 1. Left-sided obstructive uropathy due to a 6 mm obstructing distal
left ureteral calculus.
2. Other bilateral nonobstructing renal calculi.

## 2021-10-29 DIAGNOSIS — E559 Vitamin D deficiency, unspecified: Secondary | ICD-10-CM | POA: Diagnosis not present

## 2021-10-29 DIAGNOSIS — J301 Allergic rhinitis due to pollen: Secondary | ICD-10-CM | POA: Diagnosis not present

## 2021-10-29 DIAGNOSIS — M4120 Other idiopathic scoliosis, site unspecified: Secondary | ICD-10-CM | POA: Diagnosis not present

## 2021-10-29 DIAGNOSIS — K5909 Other constipation: Secondary | ICD-10-CM | POA: Diagnosis not present

## 2021-11-26 DIAGNOSIS — J301 Allergic rhinitis due to pollen: Secondary | ICD-10-CM | POA: Diagnosis not present

## 2021-11-26 DIAGNOSIS — M4120 Other idiopathic scoliosis, site unspecified: Secondary | ICD-10-CM | POA: Diagnosis not present

## 2021-11-26 DIAGNOSIS — K5909 Other constipation: Secondary | ICD-10-CM | POA: Diagnosis not present

## 2021-11-26 DIAGNOSIS — E559 Vitamin D deficiency, unspecified: Secondary | ICD-10-CM | POA: Diagnosis not present

## 2021-12-11 DIAGNOSIS — H6123 Impacted cerumen, bilateral: Secondary | ICD-10-CM | POA: Diagnosis not present

## 2022-01-30 DIAGNOSIS — Z131 Encounter for screening for diabetes mellitus: Secondary | ICD-10-CM | POA: Diagnosis not present

## 2022-01-30 DIAGNOSIS — Z0000 Encounter for general adult medical examination without abnormal findings: Secondary | ICD-10-CM | POA: Diagnosis not present

## 2022-01-30 DIAGNOSIS — Z13220 Encounter for screening for lipoid disorders: Secondary | ICD-10-CM | POA: Diagnosis not present

## 2022-01-30 DIAGNOSIS — K5909 Other constipation: Secondary | ICD-10-CM | POA: Diagnosis not present

## 2022-01-30 DIAGNOSIS — E559 Vitamin D deficiency, unspecified: Secondary | ICD-10-CM | POA: Diagnosis not present

## 2022-01-30 DIAGNOSIS — R739 Hyperglycemia, unspecified: Secondary | ICD-10-CM | POA: Diagnosis not present

## 2022-01-30 DIAGNOSIS — E7849 Other hyperlipidemia: Secondary | ICD-10-CM | POA: Diagnosis not present

## 2022-01-30 DIAGNOSIS — M4120 Other idiopathic scoliosis, site unspecified: Secondary | ICD-10-CM | POA: Diagnosis not present

## 2022-01-30 DIAGNOSIS — J301 Allergic rhinitis due to pollen: Secondary | ICD-10-CM | POA: Diagnosis not present

## 2022-03-13 DIAGNOSIS — E559 Vitamin D deficiency, unspecified: Secondary | ICD-10-CM | POA: Diagnosis not present

## 2022-03-13 DIAGNOSIS — M4120 Other idiopathic scoliosis, site unspecified: Secondary | ICD-10-CM | POA: Diagnosis not present

## 2022-03-13 DIAGNOSIS — K5909 Other constipation: Secondary | ICD-10-CM | POA: Diagnosis not present

## 2022-03-13 DIAGNOSIS — J301 Allergic rhinitis due to pollen: Secondary | ICD-10-CM | POA: Diagnosis not present

## 2022-05-29 DIAGNOSIS — Q971 Female with more than three X chromosomes: Secondary | ICD-10-CM | POA: Diagnosis not present

## 2022-05-29 DIAGNOSIS — J309 Allergic rhinitis, unspecified: Secondary | ICD-10-CM | POA: Diagnosis not present

## 2022-05-29 DIAGNOSIS — K219 Gastro-esophageal reflux disease without esophagitis: Secondary | ICD-10-CM | POA: Diagnosis not present

## 2022-05-29 DIAGNOSIS — R0602 Shortness of breath: Secondary | ICD-10-CM | POA: Diagnosis not present

## 2022-06-05 ENCOUNTER — Ambulatory Visit
Admission: RE | Admit: 2022-06-05 | Discharge: 2022-06-05 | Disposition: A | Discharge status: Home / Self Care | Payer: Medicare HMO | Source: Ambulatory Visit | Attending: Allergy and Immunology | Admitting: Allergy and Immunology

## 2022-06-05 ENCOUNTER — Other Ambulatory Visit: Payer: Self-pay | Admitting: Allergy and Immunology

## 2022-06-05 DIAGNOSIS — R0602 Shortness of breath: Secondary | ICD-10-CM

## 2022-06-19 DIAGNOSIS — E559 Vitamin D deficiency, unspecified: Secondary | ICD-10-CM | POA: Diagnosis not present

## 2022-06-19 DIAGNOSIS — J301 Allergic rhinitis due to pollen: Secondary | ICD-10-CM | POA: Diagnosis not present

## 2022-06-19 DIAGNOSIS — K5909 Other constipation: Secondary | ICD-10-CM | POA: Diagnosis not present

## 2022-06-19 DIAGNOSIS — M4120 Other idiopathic scoliosis, site unspecified: Secondary | ICD-10-CM | POA: Diagnosis not present

## 2022-08-28 DIAGNOSIS — E559 Vitamin D deficiency, unspecified: Secondary | ICD-10-CM | POA: Diagnosis not present

## 2022-08-28 DIAGNOSIS — M4120 Other idiopathic scoliosis, site unspecified: Secondary | ICD-10-CM | POA: Diagnosis not present

## 2022-08-28 DIAGNOSIS — K5909 Other constipation: Secondary | ICD-10-CM | POA: Diagnosis not present

## 2022-08-28 DIAGNOSIS — R0602 Shortness of breath: Secondary | ICD-10-CM | POA: Diagnosis not present

## 2022-08-28 DIAGNOSIS — J301 Allergic rhinitis due to pollen: Secondary | ICD-10-CM | POA: Diagnosis not present

## 2022-09-11 DIAGNOSIS — E559 Vitamin D deficiency, unspecified: Secondary | ICD-10-CM | POA: Diagnosis not present

## 2022-09-11 DIAGNOSIS — K5909 Other constipation: Secondary | ICD-10-CM | POA: Diagnosis not present

## 2022-09-11 DIAGNOSIS — M4120 Other idiopathic scoliosis, site unspecified: Secondary | ICD-10-CM | POA: Diagnosis not present

## 2022-09-11 DIAGNOSIS — J301 Allergic rhinitis due to pollen: Secondary | ICD-10-CM | POA: Diagnosis not present

## 2022-09-11 DIAGNOSIS — N946 Dysmenorrhea, unspecified: Secondary | ICD-10-CM | POA: Diagnosis not present

## 2022-12-04 DIAGNOSIS — J301 Allergic rhinitis due to pollen: Secondary | ICD-10-CM | POA: Diagnosis not present

## 2022-12-04 DIAGNOSIS — K5909 Other constipation: Secondary | ICD-10-CM | POA: Diagnosis not present

## 2022-12-04 DIAGNOSIS — M4120 Other idiopathic scoliosis, site unspecified: Secondary | ICD-10-CM | POA: Diagnosis not present

## 2022-12-04 DIAGNOSIS — K219 Gastro-esophageal reflux disease without esophagitis: Secondary | ICD-10-CM | POA: Diagnosis not present

## 2022-12-04 DIAGNOSIS — E559 Vitamin D deficiency, unspecified: Secondary | ICD-10-CM | POA: Diagnosis not present

## 2023-01-27 DIAGNOSIS — E559 Vitamin D deficiency, unspecified: Secondary | ICD-10-CM | POA: Diagnosis not present

## 2023-01-27 DIAGNOSIS — R102 Pelvic and perineal pain: Secondary | ICD-10-CM | POA: Diagnosis not present

## 2023-01-27 DIAGNOSIS — K219 Gastro-esophageal reflux disease without esophagitis: Secondary | ICD-10-CM | POA: Diagnosis not present

## 2023-01-27 DIAGNOSIS — M4120 Other idiopathic scoliosis, site unspecified: Secondary | ICD-10-CM | POA: Diagnosis not present

## 2023-01-27 DIAGNOSIS — J301 Allergic rhinitis due to pollen: Secondary | ICD-10-CM | POA: Diagnosis not present

## 2023-01-27 DIAGNOSIS — K5909 Other constipation: Secondary | ICD-10-CM | POA: Diagnosis not present

## 2023-03-06 ENCOUNTER — Encounter: Payer: Self-pay | Admitting: General Practice

## 2023-03-27 ENCOUNTER — Encounter: Payer: Medicare HMO | Admitting: Obstetrics and Gynecology

## 2023-09-30 DIAGNOSIS — R1033 Periumbilical pain: Secondary | ICD-10-CM | POA: Diagnosis not present

## 2023-10-14 DIAGNOSIS — R102 Pelvic and perineal pain: Secondary | ICD-10-CM | POA: Diagnosis not present
